# Patient Record
Sex: Female | Born: 1985 | Race: White | Hispanic: No | Marital: Single | Smoking: Current every day smoker
Health system: Southern US, Community
[De-identification: ages and names within clinical notes are randomized; demographics above are authoritative.]

## PROBLEM LIST (undated history)

## (undated) DIAGNOSIS — M1990 Unspecified osteoarthritis, unspecified site: Secondary | ICD-10-CM

## (undated) HISTORY — PX: WISDOM TOOTH EXTRACTION: SHX21

---
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
## 2005-04-14 ENCOUNTER — Ambulatory Visit: Payer: Self-pay | Admitting: Family Medicine

## 2007-09-30 ENCOUNTER — Emergency Department (HOSPITAL_COMMUNITY): Admission: EM | Admit: 2007-09-30 | Discharge: 2007-10-01 | Payer: Self-pay | Admitting: Emergency Medicine

## 2007-10-01 ENCOUNTER — Ambulatory Visit: Payer: Self-pay | Admitting: Psychiatry

## 2007-10-01 ENCOUNTER — Inpatient Hospital Stay (HOSPITAL_COMMUNITY): Admission: RE | Admit: 2007-10-01 | Discharge: 2007-10-04 | Payer: Self-pay | Admitting: Psychiatry

## 2007-10-14 ENCOUNTER — Ambulatory Visit (HOSPITAL_COMMUNITY): Payer: Self-pay | Admitting: Psychiatry

## 2007-10-20 ENCOUNTER — Ambulatory Visit (HOSPITAL_COMMUNITY): Payer: Self-pay | Admitting: Psychiatry

## 2007-10-27 ENCOUNTER — Ambulatory Visit (HOSPITAL_COMMUNITY): Payer: Self-pay | Admitting: Psychiatry

## 2008-01-11 ENCOUNTER — Ambulatory Visit (HOSPITAL_COMMUNITY): Payer: Self-pay | Admitting: Psychiatry

## 2008-09-14 ENCOUNTER — Ambulatory Visit (HOSPITAL_COMMUNITY): Payer: Self-pay | Admitting: Psychiatry

## 2008-10-31 ENCOUNTER — Ambulatory Visit (HOSPITAL_COMMUNITY): Payer: Self-pay | Admitting: Psychiatry

## 2011-01-21 NOTE — Discharge Summary (Signed)
NAME:  Angel Zimmerman, Angel Zimmerman             ACCOUNT NO.:  [id number]   MEDICAL RECORD NO.:  [id number]          PATIENT TYPE:  IPS   LOCATION:  0501                          FACILITY:  [facility]   PHYSICIAN:  Geoffery Lyons, M.D.      DATE OF BIRTH:  1985-12-08   DATE OF ADMISSION:  10/01/2007  DATE OF DISCHARGE:  10/04/2007                               DISCHARGE SUMMARY   CHIEF COMPLAINT AND HISTORY OF PRESENT ILLNESS:  This was the first  admission to [facility] for this 25 year old female  voluntarily admitted.  Endorsed that she needed help for her emotions.  She has been feeling depressed for years, getting to the point where she  was going to hurt herself.  She tried to hang herself, but she says  someone knocked at the door. She thought it was her mother and stopped.  The patient stated that she took herself to the emergency room, having  difficulty going to sleep.  Denies any hallucinations.  Stressors are  having trouble with her finances.  She knows that her father would help  her, but she feels guilty getting money from her father. Also endorsed  problem with the relationship with her ex-girlfriend.   PAST PSYCHIATRIC HISTORY:  First time at [facility].  No other  inpatient admissions or outpatient treatment because of mental health 2  years prior to this admission. Never on medications.   DRUG AND ALCOHOL HISTORY:  Denies active use of any substances.   MEDICAL HISTORY:  Asthma.   MEDICATIONS:  None prescribed.   Physical exam performed, failed to show any acute findings   LABORATORY WORKUP:  White blood cells 13.2.  Urine drug screen positive  for marijuana.   MENTAL STATUS EXAM:  Reveals alert, cooperative female with good eye  contact.  Speech is normal in rate, tempo and production.  Mood is  depressed. Affect depressed.  Thought processes are logical, coherent  and relevant.  No active delusions.  No active suicidal ideas, no  hallucinations.   Cognition well preserved.   ADMISSION DIAGNOSES:  AXIS I:  Major depressive disorder, rule out  marijuana abuse.  AXIS II:  No diagnosis.  AXIS III:  Asthma.  AXIS IV:  Moderate.  AXIS V:  Upon admission 35, high GAF in the last year 70.   COURSE IN THE HOSPITAL:  She was admitted.  She was started in  individual and group psychotherapy.  She was initially prescribed Zoloft  and Topamax. As already stated, endorsed that there was a lot of stuff  going on. She had to stay out of school this semester, goes to [employer] for  elementary vocation.  Patient works at [employer].  Is trying to work more  hours to get enough money to go to school next semester.  Had a  girlfriend on and off last several years. Feels that she was using her.  She is gone now weeks ago. Endorsed fluctuation in sleep, constantly  hungry, crying spells. Tried to hang herself with an electrical cord 6  feet up. Ex-girlfriend came for the mother.  She was  taken to the ED.  Has had suicidal ideas before but has never tried.  Has episodes of  increased energy, decreased sleep, irritability, anger.  Feels like two  different people. When angry, does not feel like herself. Endorsed  occasional alcohol use, has used marijuana, denies any other substances.  On January 24, there was a family session with the mother, her father,  and siblings.  Apparently the have always had problem with her  girlfriend mainly because she was hurtful to [place] and also tried to  make [place] his choose between her and her family. Otherwise they feel  [place] does well. She has a plan to go back to school to become an  EMS. The family lives close to each other.  An older brother has been  protective of her. She came out of the family 1-1/2 years ago.  There  was upset but still love and accept her even if they do not agree with  her choice.  They are concerned that [place] might go back with this  woman, and they would hope for her to have the  strength not to go back.  [place] agreed to contact the family if she had any thoughts of hurting  herself again.   She continued to improve, and on January 26 endorsed she was feeling  better.  She felt she was ready to go home.  Her mood was improved. Her  affect was brighter.  She was not going back with the girlfriend.  Relationship was over and endorsed that just by making that decision,  she felt so much better. Upon discharge, in full contact with reality.  No suicidal or homicidal ideations. Much improved from admission.   DISCHARGE DIAGNOSES:  AXIS I:  Mood disorder not otherwise specified.  AXIS II:  No diagnosis.  AXIS III:  Asthma.  AXIS IV:  Moderate.  AXIS V:  Upon discharge 55-60.   Discharged on:  1. Zoloft 50 mg per day,  2. Topamax 25 mg at night.  3. Trazodone 50 at bedtime as needed for sleep.   Follow up with Dr. Lolly Mustache at the [facility].      Geoffery Lyons, M.D.  Electronically Signed     IL/MEDQ  D:  10/18/2007  T:  10/19/2007  Job:  [id number]

## 2011-01-21 NOTE — H&P (Signed)
NAME:  Angel Zimmerman, Angel Zimmerman             ACCOUNT NO.:  [id number]   MEDICAL RECORD NO.:  [id number]          PATIENT TYPE:  IPS   LOCATION:  0501                          FACILITY:  [facility]   PHYSICIAN:  Geoffery Lyons, M.D.      DATE OF BIRTH:  November 18, 1985   DATE OF ADMISSION:  10/01/2007  DATE OF DISCHARGE:                       PSYCHIATRIC ADMISSION ASSESSMENT   25 year old female who was voluntarily admitted on September 30, 2007.   HISTORY OF PRESENT ILLNESS:  The patient is here.  She states she needed  help for her emotions.  She has been feeling depressed for years.  She  was getting to the point where she was going to hurt herself.  She  states she tried to hang herself, but she states someone knocked at the  door.  She though it was her mother and stopped.  The patient states  that she took herself to the emergency room.  She is having difficulty  going to sleep.  She denies any hallucinations.  The patient's stressors  are she is having trouble with her finances.  She states her father  helps her out but she feels guilty getting money from her father.  She  states also problems with her relationship with her ex-girlfriend.   PAST PSYCHIATRIC HISTORY:  First admission to [facility].  No other inpatient admissions.  No current outpatient therapy.  Did go  to mental health 2 years ago.  States was never on any medication.   SOCIAL HISTORY:  She is a single female with no children.  She lives  alone.  She works at [employer].  She has completed 1 year of college.  Denies any legal problems.   FAMILY HISTORY:  Believes her mother has problems with depression.  Mother is not on any medications at this time.  The patient smokes.  Smokes marijuana but her last use she states was a few months ago.   PRIMARY CARE PHYSICIAN:  [facility].   PAST MEDICAL HISTORY:  Asthma.   MEDICATIONS:  None prior to arrival.   DRUG ALLERGIES:  NO KNOWN DRUG ALLERGIES.   The patient was  fully assessed at [facility] emergency department.  Temperature 99, heart rate 98, respirations 18, blood pressure is  117/78.  She is 221 pounds, 5 feet 1 inches tall.   Her WBC count was 13.2.  Alcohol level less than 5.  Urinalysis shows  RBCs too numerous to count.  The patient is on her menses.  Urine drug  screen is positive for THC.  Urine pregnancy test is negative.   MENTAL STATUS EXAM:  She is in the bed, cooperative, good eye contact.  Speech is clear, normal pace and tone.  The patient's mood is neutral.  The patient's affect, she appears sad and flat.  Thought processes are  coherent.  No evidence of any thought disorder.  Cognitive function  intact.  Her memory is good.  Judgment and insight is fair.  She appears  sincere.   AXIS I:  1. Depressive disorder, not otherwise specified.  2. Cannabis abuse.   AXIS II:  Deferred.  AXIS III:  Asthma.   AXIS IV:  Problems with economic issues, psychosocial problems.   AXIS V:  Current is 35-40.   PLAN:  Contract for safety.  Stabilize mood and thinking.  We will  initiate Zoloft and Topamax.  Will monitor her closely for any side  effects.  The patient may need some individual therapy.  We will also  consider family session with her support group which may be her father.  Will also address her substance use.  Length of stay is 3-5 days.      Landry Corporal, N.P.      Geoffery Lyons, M.D.  Electronically Signed    JO/MEDQ  D:  10/01/2007  T:  10/01/2007  Job:  [id number]

## 2011-05-29 LAB — URINALYSIS, ROUTINE W REFLEX MICROSCOPIC
Glucose, UA: NEGATIVE
Specific Gravity, Urine: 1.022
Urobilinogen, UA: 0.2
pH: 6.5

## 2011-05-29 LAB — I-STAT 8, (EC8 V) (CONVERTED LAB)
Acid-base deficit: 2
Bicarbonate: 23.1
Operator id: 285841
TCO2: 24
pCO2, Ven: 41.8 — ABNORMAL LOW
pH, Ven: 7.351 — ABNORMAL HIGH

## 2011-05-29 LAB — CBC
HCT: 41
Hemoglobin: 14.1
MCHC: 34.4
MCV: 85.6
Platelets: 342
RBC: 4.79
RDW: 13.7
WBC: 13.2 — ABNORMAL HIGH

## 2011-05-29 LAB — DIFFERENTIAL
Basophils Absolute: 0.1
Basophils Relative: 0
Eosinophils Absolute: 0.1
Eosinophils Relative: 1
Lymphocytes Relative: 20
Lymphs Abs: 2.6
Monocytes Absolute: 0.8
Monocytes Relative: 6
Neutro Abs: 9.6 — ABNORMAL HIGH
Neutrophils Relative %: 73

## 2011-05-29 LAB — URINE MICROSCOPIC-ADD ON

## 2011-05-29 LAB — POCT PREGNANCY, URINE
Operator id: 285841
Preg Test, Ur: NEGATIVE

## 2011-05-29 LAB — ETHANOL: Alcohol, Ethyl (B): <5

## 2011-05-29 LAB — RAPID URINE DRUG SCREEN, HOSP PERFORMED
Amphetamines: NOT DETECTED
Barbiturates: NOT DETECTED

## 2012-02-19 ENCOUNTER — Emergency Department (HOSPITAL_COMMUNITY)
Admission: EM | Admit: 2012-02-19 | Discharge: 2012-02-19 | Disposition: A | Discharge status: Home / Self Care | Payer: Self-pay | Attending: Emergency Medicine | Admitting: Emergency Medicine

## 2012-02-19 ENCOUNTER — Encounter (HOSPITAL_COMMUNITY): Payer: Self-pay

## 2012-02-19 DIAGNOSIS — M129 Arthropathy, unspecified: Secondary | ICD-10-CM | POA: Insufficient documentation

## 2012-02-19 DIAGNOSIS — F17200 Nicotine dependence, unspecified, uncomplicated: Secondary | ICD-10-CM | POA: Insufficient documentation

## 2012-02-19 DIAGNOSIS — M549 Dorsalgia, unspecified: Secondary | ICD-10-CM | POA: Insufficient documentation

## 2012-02-19 HISTORY — DX: Unspecified osteoarthritis, unspecified site: M19.90

## 2012-02-19 LAB — URINALYSIS, ROUTINE W REFLEX MICROSCOPIC
Bilirubin Urine: NEGATIVE
Glucose, UA: NEGATIVE mg/dL
Hgb urine dipstick: NEGATIVE
Ketones, ur: NEGATIVE mg/dL
Leukocytes, UA: NEGATIVE
Nitrite: NEGATIVE
Protein, ur: NEGATIVE mg/dL
Specific Gravity, Urine: 1.033 — ABNORMAL HIGH (ref 1.005–1.030)
Urobilinogen, UA: 0.2 mg/dL (ref 0.0–1.0)
pH: 5.5 (ref 5.0–8.0)

## 2012-02-19 LAB — POCT PREGNANCY, URINE: Preg Test, Ur: NEGATIVE

## 2012-02-19 MED ORDER — HYDROCODONE-ACETAMINOPHEN 5-325 MG PO TABS: 2.0000 | ORAL_TABLET | ORAL | Status: AC | PRN

## 2012-02-19 MED ORDER — IBUPROFEN 600 MG PO TABS: 600.0000 mg | ORAL_TABLET | Freq: Four times a day (QID) | ORAL | Status: AC | PRN

## 2012-02-19 MED ORDER — DIAZEPAM 5 MG PO TABS: 5.0000 mg | ORAL_TABLET | Freq: Two times a day (BID) | ORAL | Status: AC

## 2012-02-19 NOTE — ED Provider Notes (Signed)
History     CSN: [id number]  Arrival date & time 02/19/12  1844   First MD Initiated Contact with Patient 02/19/12 2109      Chief Complaint  Patient presents with  . Back Pain    (Consider location/radiation/quality/duration/timing/severity/associated sxs/prior treatment) HPI Comments: Patient reports that she began having pain across her lower back earlier today after mowing her lawn. Pain radiates to her right hip.  No acute injury or trauma.  She has not taken anything for the pain.  Pain worse with extension of lower back.    Patient is a 26 y.o. female presenting with back pain. The history is provided by the patient.  Back Pain  This is a new problem. The current episode started 6 to 12 hours ago. The problem occurs constantly. The problem has not changed since onset.The pain is associated with no known injury. The pain is present in the lumbar spine. The pain is moderate. The symptoms are aggravated by twisting and bending. Pertinent negatives include no fever, no numbness, no abdominal pain, no bowel incontinence, no perianal numbness, no bladder incontinence, no dysuria, no paresthesias, no paresis, no tingling and no weakness.    Past Medical History  Diagnosis Date  . Arthritis     No past surgical history on file.  No family history on file.  History  Substance Use Topics  . Smoking status: Current Everyday Smoker -- 0.5 packs/day  . Smokeless tobacco: Not on file  . Alcohol Use: Yes     social    OB History    Grav Para Term Preterm Abortions TAB SAB Ect Mult Living                  Review of Systems  Constitutional: Negative for fever and chills.  HENT: Negative for neck pain and neck stiffness.   Gastrointestinal: Negative for nausea, vomiting, abdominal pain and bowel incontinence.  Genitourinary: Negative for bladder incontinence, dysuria, hematuria and decreased urine volume.       No bowel or bladder incontinence  Musculoskeletal: Positive for  back pain. Negative for joint swelling and gait problem.  Skin: Negative for color change and wound.  Neurological: Negative for tingling, weakness, numbness and paresthesias.  Psychiatric/Behavioral: Negative for confusion.    Allergies  Review of patient's allergies indicates no known allergies.  Home Medications  No current outpatient prescriptions on file.  BP 111/56  Pulse 55  Temp 97.9 F (36.6 C) (Oral)  Resp 20  Ht 5' (1.524 m)  Wt 215 lb (97.523 kg)  BMI 41.99 kg/m2  SpO2 100%  LMP 01/26/2012  Physical Exam  Nursing note and vitals reviewed. Constitutional: She is oriented to person, place, and time. She appears well-developed and well-nourished. No distress.  HENT:  Head: Normocephalic and atraumatic.  Eyes: Conjunctivae and EOM are normal. Pupils are equal, round, and reactive to light. No scleral icterus.  Neck: Normal range of motion and full passive range of motion without pain. Neck supple. No spinous process tenderness and no muscular tenderness present. No rigidity. Normal range of motion present. No Brudzinski's sign noted.  Cardiovascular: Normal rate, regular rhythm and intact distal pulses.  Exam reveals no gallop and no friction rub.   No murmur heard. Pulmonary/Chest: Effort normal and breath sounds normal. No respiratory distress. She has no wheezes. She has no rales. She exhibits no tenderness.  Musculoskeletal:       Cervical back: She exhibits normal range of motion, no tenderness, no bony tenderness  and no pain.       Thoracic back: She exhibits no tenderness, no bony tenderness and no pain.       Lumbar back: She exhibits no tenderness, no bony tenderness, no deformity, no spasm and normal pulse.       Bilateral lower extremities nontender without color change, baseline range of motion of extremities with intact distal pulses, capillary refill less than 2 seconds bilaterally.  Pt has increased pain w ROM of lumbar spine. Pain w ambulation, no sign of  ataxia.  Neurological: She is alert and oriented to person, place, and time. She has normal strength and normal reflexes. No sensory deficit. Gait (no ataxia, slowed and hunched d/t pain ) abnormal.       Sensation at baseline for light touch in all 4 distal extremities, motor symmetric & bilateral 5/5 (hips: abduction, adduction, flexion; knee: flexion & extension; foot: dorsiflexion, plantar flexion, toes: dorsi flexion) Patellar & ankle reflexes intact.   Skin: Skin is warm and dry. No rash noted. She is not diaphoretic. No erythema. No pallor.  Psychiatric: She has a normal mood and affect.    ED Course  Procedures (including critical care time)  Labs Reviewed  URINALYSIS, ROUTINE W REFLEX MICROSCOPIC - Abnormal; Notable for the following:    APPearance CLOUDY (*)     Specific Gravity, Urine 1.033 (*)     All other components within normal limits  POCT PREGNANCY, URINE   No results found.   No diagnosis found.    MDM  Patient with back pain.  No neurological deficits and normal neuro exam.  Patient can walk but states is painful.  No loss of bowel or bladder control.  No concern for cauda equina.  No fever, night sweats, weight loss, h/o cancer, IVDU.  Pain most likely muscular.  RICE protocol and pain medicine indicated and discussed with patient.  Patient also given prescription for muscle relaxer.        Pascal Lux Cache, PA-C 02/20/12 0225

## 2012-02-19 NOTE — ED Notes (Signed)
Pt c/o low back pain radiating into right hip. Denies trauma, states mow the yard today. Pt moving slowly guarding.

## 2012-02-19 NOTE — Discharge Instructions (Signed)
Followup with orthopedics if symptoms continue. Use conservative methods at home including heat therapy and cold therapy as we discussed. More information on cold therapy is listed below.  It is not recommended to use heat treatment directly after an acute injury.  SEEK IMMEDIATE MEDICAL ATTENTION IF: New numbness, tingling, weakness, or problem with the use of your arms or legs.  Severe back pain not relieved with medications.  Change in bowel or bladder control.  Increasing pain in any areas of the body (such as chest or abdominal pain).  Shortness of breath, dizziness or fainting.  Nausea (feeling sick to your stomach), vomiting, fever, or sweats.  COLD THERAPY DIRECTIONS:  Ice or gel packs can be used to reduce both pain and swelling. Ice is the most helpful within the first 24 to 48 hours after an injury or flareup from overusing a muscle or joint.  Ice is effective, has very few side effects, and is safe for most people to use.   If you expose your skin to cold temperatures for too long or without the proper protection, you can damage your skin or nerves. Watch for signs of skin damage due to cold.   HOME CARE INSTRUCTIONS  Follow these tips to use ice and cold packs safely.  Place a dry or damp towel between the ice and skin. A damp towel will cool the skin more quickly, so you may need to shorten the time that the ice is used.  For a more rapid response, add gentle compression to the ice.  Ice for no more than 10 to 20 minutes at a time. The bonier the area you are icing, the less time it will take to get the benefits of ice.  Check your skin after 5 minutes to make sure there are no signs of a poor response to cold or skin damage.  Rest 20 minutes or more in between uses.  Once your skin is numb, you can end your treatment. You can test numbness by very lightly touching your skin. The touch should be so light that you do not see the skin dimple from the pressure of your fingertip. When  using ice, most people will feel these normal sensations in this order: cold, burning, aching, and numbness.  Do not use ice on someone who cannot communicate their responses to pain, such as small children or people with dementia.   HOW TO MAKE AN ICE PACK  To make an ice pack, do one of the following:  Place crushed ice or a bag of frozen vegetables in a sealable plastic bag. Squeeze out the excess air. Place this bag inside another plastic bag. Slide the bag into a pillowcase or place a damp towel between your skin and the bag.  Mix 3 parts water with 1 part rubbing alcohol. Freeze the mixture in a sealable plastic bag. When you remove the mixture from the freezer, it will be slushy. Squeeze out the excess air. Place this bag inside another plastic bag. Slide the bag into a pillowcase or place a damp towel between your skin and the bag.   SEEK MEDICAL CARE IF:  You develop white spots on your skin. This may give the skin a blotchy (mottled) appearance.  Your skin turns blue or pale.  Your skin becomes waxy or hard.  Your swelling gets worse.  MAKE SURE YOU:  Understand these instructions.  Will watch your condition.  Will get help right away if you are not doing well or  get worse.    RESOURCE GUIDE  Chronic Pain Problems: Contact [facility]  [phone] Patients need to be referred by their primary care doctor.  Insufficient Money for Medicine: Contact United Way:  call "211" or [facility] 402-541-0603.  No Primary Care Doctor: - Call [facility]  [phone] - can help you locate a primary care doctor that  accepts your insurance, provides certain services, etc. - Physician Referral Service- [phone]  Agencies that provide inexpensive medical care: - [facility]  [phone] - [facility]  [phone] - [facility]  [phone] - Women's Clinic  [phone] - Planned Parenthood  [phone] [facility]  [phone]  Medicaid-accepting [facility] Providers: - [facility]- [address]  [phone], Mon-Fri 9am-7pm, Sat 9am-1pm - [facility]- [address] [place], [address] [place]  [phone] - [facility]- [address] [place]  [phone] [facility]- [address]  [phone] - Renaye Rakers- [address] [place], [address], [phone]  Only accepts [place] patients after they have their name  applied to their card  Self Pay (no insurance) in [place]: - Sickle Cell Patients: Dr Willey Blade, [facility]  [address] [place], [phone] - [facility]- [address] [place]  [phone]       [facility]- [address]       -     [facility]- see information above (Speak to [employer] if you do not have insurance)       -  [facility]- [address] [place], [phone]       -  [facility]- [address],  [phone]       -  [facility]- [address] [phone]       -  Dr Julio Sicks-  [address], [place], [phone]       -  [facility]- [address], [phone]       -  [facility]- [address] [phone], also [address], [phone]       -    [facility]- [address] [place], [phone], 1st & 3rd Saturday   every month, 10am-1pm  1) Find a Doctor and Pay Out of Pocket Although you won't have to find out who is covered by your insurance plan, it is a good idea to ask around and get recommendations. You will then need to call the office and see if the doctor you have chosen will accept you as a new patient and what types of options they offer for patients who are self-pay. Some doctors offer discounts or will set up payment plans for their patients who do not have insurance, but you will need to ask so you  aren't surprised when you get to your appointment.  2) Contact Your Local Health Department Not all health departments have doctors that can see patients for sick visits, but many do, so it is worth a call to see if yours does. If you don't know where your local health department is, you can check in your phone book. The CDC also has a tool to help you locate your state's health department,  and many state websites also have listings of all of their local health departments.  3) Find a Walk-in Clinic If your illness is not likely to be very severe or complicated, you may want to try a walk in clinic. These are popping up all over the country in pharmacies, drugstores, and shopping centers. They're usually staffed by nurse practitioners or physician assistants that have been trained to treat common illnesses and complaints. They're usually fairly quick and inexpensive. However, if you have serious medical issues or chronic medical problems, these are probably not your best option  STD Testing - [facility] of [facility], STD Clinic, [address], [place], phone [phone] or [phone].  Monday - Friday, call for an appointment. [facility] Department of [employer], STD Clinic, [address], [place], phone [phone] or [phone].  Monday - Friday, call for an appointment.  Abuse/Neglect: [facility] Child Abuse Hotline 980-846-3587 [facility] Child Abuse Hotline (267) 173-9881 (After Hours)  Emergency Shelter:  [facility] 984-446-5740  Maternity Homes: - [facility] [phone] - [facility] [phone]  MRSA Hotline #:   401-336-7088  [facility]  [facility] [place]  United Way [facility] Dept. [address]                 [address]         [address] [place] [address]  Blondell Reveal Phone:  [phone]                                  Phone:  [phone]                   Phone:  [phone]  [facility], [phone] - [phone]       -     [facility] in [place], [address],                                  [phone], [facility] Child Abuse Hotline 313-101-1299 or 218-467-2372 (After Hours)   Behavioral Health Services  Substance Abuse Resources: - [facility] Drug [facility]  [phone] - [facility] [phone] - [facility] [phone] Floydene Flock [phone] - Residential & Outpatient Substance Abuse Program  [phone]  Psychological Services: [facility]  ([phone] Services  [phone] - [facility], [phone] [place]. [address] [place], ACCESS LINE: [phone] or 3375202751, [employer]  Dental Assistance  If unable to pay or uninsured, contact:  [facility] or [facility]. to become qualified for the adult dental clinic.  Patients with Medicaid: [facility] [phone] [address], [phone] Shonna Chock  [address], [phone]  If unable to pay, or uninsured, contact [facility] [phone]) or [facility] Department [phone] in [place], [phone] in [facility]) to become qualified for the adult dental clinic  Other Low-Cost Community Dental Services: - [facility]- [address] [place], [place], [place], [address], [phone], [address]. [address], 2nd and 4th Thursday of the month at 6:30am.  10 clients each day by appointment, can sometimes see walk-in patients if someone does not show for an appointment. [facility]- [address] [place], [place], [address], [phone] - [facility]- [address], [place], [place], [address], [phone] - [facility]- [phone] [facility] Department- [phone] [facility] Department- [phone]

## 2012-02-20 NOTE — ED Provider Notes (Signed)
Medical screening examination/treatment/procedure(s) were performed by non-physician practitioner and as supervising physician I was immediately available for consultation/collaboration.  Raeford Razor, MD 02/20/12 1319

## 2013-05-19 ENCOUNTER — Encounter: Payer: Self-pay | Admitting: Nurse Practitioner

## 2013-05-19 ENCOUNTER — Ambulatory Visit (INDEPENDENT_AMBULATORY_CARE_PROVIDER_SITE_OTHER): Payer: BC Managed Care – PPO | Admitting: Nurse Practitioner

## 2013-05-19 ENCOUNTER — Ambulatory Visit (INDEPENDENT_AMBULATORY_CARE_PROVIDER_SITE_OTHER): Payer: BC Managed Care – PPO

## 2013-05-19 ENCOUNTER — Telehealth: Payer: Self-pay | Admitting: Nurse Practitioner

## 2013-05-19 VITALS — BP 117/75 | HR 51 | Temp 97.9°F | Ht 61.0 in | Wt 226.0 lb

## 2013-05-19 DIAGNOSIS — R0781 Pleurodynia: Secondary | ICD-10-CM

## 2013-05-19 DIAGNOSIS — R079 Chest pain, unspecified: Secondary | ICD-10-CM

## 2013-05-19 MED ORDER — IBUPROFEN 800 MG PO TABS: 800.0000 mg | ORAL_TABLET | Freq: Three times a day (TID) | ORAL | Status: DC | PRN

## 2013-05-19 MED ORDER — CYCLOBENZAPRINE HCL 5 MG PO TABS: 5.0000 mg | ORAL_TABLET | Freq: Three times a day (TID) | ORAL | Status: DC | PRN

## 2013-05-19 NOTE — Telephone Encounter (Signed)
Patient complains of back/rib pain. No known injury and no cough. Appt scheduled.  Patient aware.

## 2013-05-19 NOTE — Patient Instructions (Signed)
Muscle Strain  Muscle strain occurs when a muscle is stretched beyond its normal length. A small number of muscle fibers generally are torn. This is especially common in athletes. This happens when a sudden, violent force placed on a muscle stretches it too far. Usually, recovery from muscle strain takes 1 to 2 weeks. Complete healing will take 5 to 6 weeks.   HOME CARE INSTRUCTIONS    While awake, apply ice to the sore muscle for the first 2 days after the injury.   Put ice in a plastic bag.   Place a towel between your skin and the bag.   Leave the ice on for 15-20 minutes each hour.   Do not use the strained muscle for several days, until you no longer have pain.   You may wrap the injured area with an elastic bandage for comfort. Be careful not to wrap it too tightly. This may interfere with blood circulation or increase swelling.   Only take over-the-counter or prescription medicines for pain, discomfort, or fever as directed by your caregiver.  SEEK MEDICAL CARE IF:   You have increasing pain or swelling in the injured area.  MAKE SURE YOU:    Understand these instructions.   Will watch your condition.   Will get help right away if you are not doing well or get worse.  Document Released: 08/25/2005 Document Revised: 11/17/2011 Document Reviewed: 09/06/2011  ExitCare Patient Information 2014 [employer].

## 2013-05-19 NOTE — Progress Notes (Signed)
  Subjective:    Patient ID: Angel Zimmerman, female    DOB: 1985-10-09, 27 y.o.   MRN: [id number]  Flank Pain This is a new problem. The current episode started in the past 7 days (denies injury). The problem occurs constantly. The problem has been waxing and waning since onset. The quality of the pain is described as stabbing. The pain does not radiate. The pain is at a severity of 6/10. The pain is moderate. The pain is worse during the day. The symptoms are aggravated by lying down and coughing. Stiffness is present in the morning. Pertinent negatives include no chest pain or fever. Risk factors include obesity. Treatments tried: tried wrapping with tight band  The treatment provided no relief.      Review of Systems  Constitutional: Negative for fever, chills, activity change, appetite change and fatigue.  Respiratory: Negative for cough, chest tightness and shortness of breath.   Cardiovascular: Negative for chest pain, palpitations and leg swelling.  Genitourinary: Positive for flank pain.  Musculoskeletal: Positive for back pain (mild but normal for her).       Objective:   Physical Exam  Constitutional: She appears well-developed and well-nourished.  Cardiovascular: Normal rate, regular rhythm and normal heart sounds.   Pulmonary/Chest: Effort normal and breath sounds normal.  Point tenderness on palpation along 10th rib  Skin: Skin is warm. No rash noted. No erythema.   BP 117/75  Pulse 51  Temp(Src) 97.9 F (36.6 C) (Oral)  Ht 5\' 1"  (1.549 m)  Wt 226 lb (102.513 kg)  BMI 42.72 kg/m2 Chest x ray- no ab=cute findings-Preliminary reading by Paulene Floor, FNP  [facility]        Assessment & Plan:  1. Rib pain on left side *moist heat No heavy lifting - DG Chest 2 View; Future - cyclobenzaprine (FLEXERIL) 5 MG tablet; Take 1 tablet (5 mg total) by mouth 3 (three) times daily as needed for muscle spasms.  Dispense: 30 tablet; Refill: 1 - ibuprofen (ADVIL,MOTRIN) 800 MG  tablet; Take 1 tablet (800 mg total) by mouth every 8 (eight) hours as needed for pain.  Dispense: 30 tablet; Refill: 0 Out of work till Monday  Bennie Pierini, FNP

## 2013-07-05 ENCOUNTER — Other Ambulatory Visit (HOSPITAL_COMMUNITY)
Admission: RE | Admit: 2013-07-05 | Discharge: 2013-07-05 | Disposition: A | Discharge status: Home / Self Care | Payer: Self-pay | Source: Ambulatory Visit | Attending: Unknown Physician Specialty | Admitting: Unknown Physician Specialty

## 2013-07-05 DIAGNOSIS — N870 Mild cervical dysplasia: Secondary | ICD-10-CM | POA: Insufficient documentation

## 2013-07-05 DIAGNOSIS — R87810 Cervical high risk human papillomavirus (HPV) DNA test positive: Secondary | ICD-10-CM | POA: Insufficient documentation

## 2013-07-05 DIAGNOSIS — Z124 Encounter for screening for malignant neoplasm of cervix: Secondary | ICD-10-CM | POA: Insufficient documentation

## 2013-07-05 DIAGNOSIS — R87612 Low grade squamous intraepithelial lesion on cytologic smear of cervix (LGSIL): Secondary | ICD-10-CM | POA: Insufficient documentation

## 2016-12-25 ENCOUNTER — Encounter: Payer: Self-pay | Admitting: Family Medicine

## 2016-12-25 ENCOUNTER — Ambulatory Visit (INDEPENDENT_AMBULATORY_CARE_PROVIDER_SITE_OTHER): Payer: Commercial Managed Care - HMO | Admitting: Family Medicine

## 2016-12-25 VITALS — BP 120/78 | HR 67 | Temp 98.2°F | Ht 61.0 in | Wt 229.0 lb

## 2016-12-25 DIAGNOSIS — F17200 Nicotine dependence, unspecified, uncomplicated: Secondary | ICD-10-CM | POA: Diagnosis not present

## 2016-12-25 DIAGNOSIS — J40 Bronchitis, not specified as acute or chronic: Secondary | ICD-10-CM | POA: Diagnosis not present

## 2016-12-25 MED ORDER — AZITHROMYCIN 250 MG PO TABS: ORAL_TABLET | ORAL | 0 refills | Status: DC

## 2016-12-25 MED ORDER — BUPROPION HCL ER (XL) 150 MG PO TB24: 150.0000 mg | ORAL_TABLET | Freq: Every day | ORAL | 1 refills | Status: DC

## 2016-12-25 MED ORDER — ALBUTEROL SULFATE HFA 108 (90 BASE) MCG/ACT IN AERS: 2.0000 | INHALATION_SPRAY | Freq: Four times a day (QID) | RESPIRATORY_TRACT | 0 refills | Status: DC | PRN

## 2016-12-25 NOTE — Progress Notes (Signed)
BP 120/78   Pulse 67   Temp 98.2 F (36.8 C) (Oral)   Ht  (1.549 m)   Wt 229 lb (103.9 kg)   LMP 12/24/2016 (Exact Date)   SpO2 98%   BMI 43.27 kg/m    Subjective:    Patient ID: Angel Zimmerman, female    DOB: [birth year], 31 y.o.   MRN: [id number]  HPI: Angel Zimmerman is a 31 y.o. female presenting on 12/25/2016 for Cough (x 7-10 days; worse at night; taking OTC Mucinex DM and cold syrup); Chest congestion; and Wheezing   HPI Cough and congestion and wheezing Patient has been having cough and congestion and wheezing this been going on for the past 7 or 10 days. She has been having increased wheezing and coughing spells at night especially. They've been waking her up at night and keeping her up. She denies any fevers or chills or shortness of breath. She has been using over-the-counter Mucinex and it does not seem to be helping as much. She does say she has a history of asthma as a child and feels like she is having that again right now. He has been using some cough syrup as well which helps some.  Relevant past medical, surgical, family and social history reviewed and updated as indicated. Interim medical history since our last visit reviewed. Allergies and medications reviewed and updated.  Review of Systems  Constitutional: Negative for chills and fever.  HENT: Positive for congestion, postnasal drip, rhinorrhea, sinus pressure, sneezing and sore throat. Negative for ear discharge and ear pain.   Eyes: Negative for pain, redness and visual disturbance.  Respiratory: Positive for wheezing. Negative for chest tightness and shortness of breath.   Cardiovascular: Negative for chest pain and leg swelling.  Genitourinary: Negative for difficulty urinating and dysuria.  Musculoskeletal: Negative for back pain and gait problem.  Skin: Negative for rash.  Neurological: Negative for light-headedness and headaches.  Psychiatric/Behavioral: Negative for agitation and behavioral  problems.  All other systems reviewed and are negative.   Per HPI unless specifically indicated above  Social History   Social History  . Marital status: Single    Spouse name: N/A  . Number of children: N/A  . Years of education: N/A   Occupational History  . Not on file.   Social History Main Topics  . Smoking status: Current Every Day Smoker    Packs/day: 1.00    Years: 10.00  . Smokeless tobacco: Never Used  . Alcohol use Yes     Comment: social  . Drug use: No  . Sexual activity: Yes    Birth control/ protection: None   Other Topics Concern  . Not on file   Social History Narrative  . No narrative on file    Past Surgical History:  Procedure Laterality Date  . WISDOM TOOTH EXTRACTION      Family History  Problem Relation Age of Onset  . COPD Mother   . Cancer Mother     esophogea  . Stroke Father   . COPD Maternal Grandmother   . Heart disease Maternal Grandmother   . Diabetes Maternal Grandfather   . Heart disease Maternal Grandfather   . Heart disease Paternal Grandmother     Allergies as of 12/25/2016   No Known Allergies     Medication List       Accurate as of 12/25/16  8:48 AM. Always use your most recent med list.  albuterol 108 (90 Base) MCG/ACT inhaler Commonly known as:  PROVENTIL HFA;VENTOLIN HFA Inhale 2 puffs into the lungs every 6 (six) hours as needed for wheezing or shortness of breath.   azithromycin 250 MG tablet Commonly known as:  ZITHROMAX Take 2 the first day and then one each day after.   buPROPion 150 MG 24 hr tablet Commonly known as:  WELLBUTRIN XL Take 1 tablet (150 mg total) by mouth daily.          Objective:    BP 120/78   Pulse 67   Temp 98.2 F (36.8 C) (Oral)   Ht  (1.549 m)   Wt 229 lb (103.9 kg)   LMP 12/24/2016 (Exact Date)   SpO2 98%   BMI 43.27 kg/m   Wt Readings from Last 3 Encounters:  12/25/16 229 lb (103.9 kg)  05/19/13 226 lb (102.5 kg)  02/19/12 215 lb (97.5 kg)      Physical Exam  Constitutional: She is oriented to person, place, and time. She appears well-developed and well-nourished. No distress.  HENT:  Right Ear: Tympanic membrane, external ear and ear canal normal.  Left Ear: Tympanic membrane, external ear and ear canal normal.  Nose: Mucosal edema and rhinorrhea present. No epistaxis. Right sinus exhibits no maxillary sinus tenderness and no frontal sinus tenderness. Left sinus exhibits no maxillary sinus tenderness and no frontal sinus tenderness.  Mouth/Throat: Uvula is midline and mucous membranes are normal. Posterior oropharyngeal edema and posterior oropharyngeal erythema present. No oropharyngeal exudate or tonsillar abscesses.  Eyes: Conjunctivae and EOM are normal.  Cardiovascular: Normal rate, regular rhythm, normal heart sounds and intact distal pulses.   No murmur heard. Pulmonary/Chest: Effort normal and breath sounds normal. No respiratory distress. She has no wheezes.  Musculoskeletal: Normal range of motion. She exhibits no edema or tenderness.  Neurological: She is alert and oriented to person, place, and time. Coordination normal.  Skin: Skin is warm and dry. No rash noted. She is not diaphoretic.  Psychiatric: She has a normal mood and affect. Her behavior is normal.  Vitals reviewed.   Results for orders placed or performed during the hospital encounter of 02/19/12  U/A (may I&O cath if menses)  Result Value Ref Range   Color, Urine YELLOW YELLOW   APPearance CLOUDY (A) CLEAR   Specific Gravity, Urine 1.033 (H) 1.005 - 1.030   pH 5.5 5.0 - 8.0   Glucose, UA NEGATIVE NEGATIVE mg/dL   Hgb urine dipstick NEGATIVE NEGATIVE   Bilirubin Urine NEGATIVE NEGATIVE   Ketones, ur NEGATIVE NEGATIVE mg/dL   Protein, ur NEGATIVE NEGATIVE mg/dL   Urobilinogen, UA 0.2 0.0 - 1.0 mg/dL   Nitrite NEGATIVE NEGATIVE   Leukocytes, UA NEGATIVE NEGATIVE  Pregnancy, urine POC  Result Value Ref Range   Preg Test, Ur NEGATIVE NEGATIVE       Assessment & Plan:   Problem List Items Addressed This Visit    None    Visit Diagnoses    Bronchitis    -  Primary   Also recommended for her to pick up an allergy pill, she does have a history of asthma so sent an albuterol inhaler with the azithromycin.   Relevant Medications   azithromycin (ZITHROMAX) 250 MG tablet   albuterol (PROVENTIL HFA;VENTOLIN HFA) 108 (90 Base) MCG/ACT inhaler   Needs smoking cessation education       Relevant Medications   buPROPion (WELLBUTRIN XL) 150 MG 24 hr tablet       Follow up  plan: Return in about 4 weeks (around 01/22/2017), or if symptoms worsen or fail to improve, for Pap smear and well exam and fasting labs, recheck smoking.  Arville Care, MD [facility] 12/25/2016, 8:48 AM

## 2017-01-28 ENCOUNTER — Encounter: Payer: Commercial Managed Care - HMO | Admitting: Family Medicine

## 2017-02-12 ENCOUNTER — Encounter: Payer: Commercial Managed Care - HMO | Admitting: Family Medicine

## 2017-02-13 ENCOUNTER — Encounter: Payer: Commercial Managed Care - HMO | Admitting: Family Medicine

## 2017-02-19 ENCOUNTER — Ambulatory Visit (INDEPENDENT_AMBULATORY_CARE_PROVIDER_SITE_OTHER): Payer: 59 | Admitting: Family Medicine

## 2017-02-19 ENCOUNTER — Encounter: Payer: Self-pay | Admitting: Family Medicine

## 2017-02-19 VITALS — BP 113/79 | HR 74 | Temp 97.6°F | Ht 61.0 in | Wt 221.0 lb

## 2017-02-19 DIAGNOSIS — M222X2 Patellofemoral disorders, left knee: Secondary | ICD-10-CM

## 2017-02-19 DIAGNOSIS — Z0000 Encounter for general adult medical examination without abnormal findings: Secondary | ICD-10-CM

## 2017-02-19 DIAGNOSIS — Z131 Encounter for screening for diabetes mellitus: Secondary | ICD-10-CM

## 2017-02-19 DIAGNOSIS — Z13220 Encounter for screening for lipoid disorders: Secondary | ICD-10-CM

## 2017-02-19 DIAGNOSIS — F17200 Nicotine dependence, unspecified, uncomplicated: Secondary | ICD-10-CM

## 2017-02-19 LAB — LIPID PANEL
CHOL/HDL RATIO: 2.9 ratio (ref 0.0–4.4)
CHOLESTEROL TOTAL: 140 mg/dL (ref 100–199)
HDL: 49 mg/dL (ref >39)
LDL CALC: 75 mg/dL (ref 0–99)
TRIGLYCERIDES: 78 mg/dL (ref 0–149)
VLDL CHOLESTEROL CAL: 16 mg/dL (ref 5–40)

## 2017-02-19 LAB — CMP14+EGFR
A/G RATIO: 1.5 (ref 1.2–2.2)
ALK PHOS: 48 IU/L (ref 39–117)
ALT: 12 IU/L (ref 0–32)
AST: 16 IU/L (ref 0–40)
Albumin: 3.8 g/dL (ref 3.5–5.5)
BILIRUBIN TOTAL: 0.3 mg/dL (ref 0.0–1.2)
BUN/Creatinine Ratio: 13 (ref 9–23)
BUN: 8 mg/dL (ref 6–20)
CALCIUM: 8.8 mg/dL (ref 8.7–10.2)
CHLORIDE: 104 mmol/L (ref 96–106)
CO2: 24 mmol/L (ref 20–29)
Creatinine, Ser: 0.61 mg/dL (ref 0.57–1.00)
GFR calc Af Amer: 140 mL/min/{1.73_m2} (ref >59)
GFR calc non Af Amer: 121 mL/min/{1.73_m2} (ref >59)
Globulin, Total: 2.5 g/dL (ref 1.5–4.5)
Glucose: 82 mg/dL (ref 65–99)
POTASSIUM: 4.6 mmol/L (ref 3.5–5.2)
Sodium: 140 mmol/L (ref 134–144)
Total Protein: 6.3 g/dL (ref 6.0–8.5)

## 2017-02-19 LAB — CBC WITH DIFFERENTIAL/PLATELET
Basophils Absolute: 0 10*3/uL (ref 0.0–0.2)
Basos: 0 %
EOS (ABSOLUTE): 0.3 10*3/uL (ref 0.0–0.4)
EOS: 4 %
HEMATOCRIT: 40.6 % (ref 34.0–46.6)
HEMOGLOBIN: 13.7 g/dL (ref 11.1–15.9)
IMMATURE GRANS (ABS): 0 10*3/uL (ref 0.0–0.1)
Immature Granulocytes: 0 %
LYMPHS ABS: 2.4 10*3/uL (ref 0.7–3.1)
LYMPHS: 31 %
MCH: 29 pg (ref 26.6–33.0)
MCHC: 33.7 g/dL (ref 31.5–35.7)
MCV: 86 fL (ref 79–97)
MONOCYTES: 8 %
Monocytes Absolute: 0.6 10*3/uL (ref 0.1–0.9)
NEUTROS ABS: 4.4 10*3/uL (ref 1.4–7.0)
Neutrophils: 57 %
Platelets: 324 10*3/uL (ref 150–379)
RBC: 4.72 x10E6/uL (ref 3.77–5.28)
RDW: 14.1 % (ref 12.3–15.4)
WBC: 7.7 10*3/uL (ref 3.4–10.8)

## 2017-02-19 MED ORDER — BUPROPION HCL ER (XL) 300 MG PO TB24: 300.0000 mg | ORAL_TABLET | Freq: Every day | ORAL | 2 refills | Status: DC

## 2017-02-19 NOTE — Progress Notes (Signed)
BP 113/79   Pulse 74   Temp 97.6 F (36.4 C) (Oral)   Ht _0  (1.549 m)   Wt 221 lb (100.2 kg)   BMI 41.76 kg/m    Subjective:    Patient ID: Angel Zimmerman, female    DOB: 1985/12/29, 31 y.o.   MRN: [id number]  HPI: Angel Zimmerman is a 31 y.o. female presenting on 02/19/2017 for Labwork (patient is fasting) and Smoking cessation followup (patient is still fasting, has cut back some, taking Wellbutrin sporadically)   HPI Well adult exam and fasting labs Patient is coming in for well adult exam and fasting labs. She was going do her Pap smear but she is on her period today so she will hold off and do that at a later date. She is still working on her smoking cessation and the Wellbutrin is helping some. She denies any major health issues except for some intermittent pain in her left medial knee. She says she gets it more when she does prolonged walking or going upstairs. She does not have as many issues going downstairs. She denies any fevers or chills redness or warmth. The pain is not there presently today but bothers her at least 3 or 4 days out of the week. It has been hurting more and more frequently recently. Patient denies any chest pain, shortness of breath, headaches or vision issues, abdominal complaints, diarrhea, nausea, vomiting.   Relevant past medical, surgical, family and social history reviewed and updated as indicated. Interim medical history since our last visit reviewed. Allergies and medications reviewed and updated.  Review of Systems  Constitutional: Negative for chills and fever.  HENT: Negative for ear pain and tinnitus.   Eyes: Negative for pain.  Respiratory: Negative for cough, shortness of breath and wheezing.   Cardiovascular: Negative for chest pain, palpitations and leg swelling.  Gastrointestinal: Negative for abdominal pain, blood in stool, constipation and diarrhea.  Genitourinary: Negative for dysuria and hematuria.  Musculoskeletal: Positive  for arthralgias. Negative for back pain, gait problem, joint swelling and myalgias.  Skin: Negative for color change and rash.  Neurological: Negative for dizziness, weakness and headaches.  Psychiatric/Behavioral: Negative for suicidal ideas.    Per HPI unless specifically indicated above   Allergies as of 02/19/2017   No Known Allergies     Medication List       Accurate as of 02/19/17 11:29 AM. Always use your most recent med list.          buPROPion 150 MG 24 hr tablet Commonly known as:  WELLBUTRIN XL Take 1 tablet (150 mg total) by mouth daily.          Objective:    BP 113/79   Pulse 74   Temp 97.6 F (36.4 C) (Oral)   Ht _1  (1.549 m)   Wt 221 lb (100.2 kg)   BMI 41.76 kg/m   Wt Readings from Last 3 Encounters:  02/19/17 221 lb (100.2 kg)  12/25/16 229 lb (103.9 kg)  05/19/13 226 lb (102.5 kg)    Physical Exam  Constitutional: She is oriented to person, place, and time. She appears well-developed and well-nourished. No distress.  Eyes: Conjunctivae are normal.  Cardiovascular: Normal rate, regular rhythm, normal heart sounds and intact distal pulses.   No murmur heard. Pulmonary/Chest: Effort normal and breath sounds normal. No respiratory distress. She has no wheezes. She has no rales.  Musculoskeletal: Normal range of motion. She exhibits no edema or tenderness (No  tenderness on exam in left knee, no joint laxity or signs of MCL or anterior cruciate ligament or LCL damage. No signs of meniscal damage either.).  Neurological: She is alert and oriented to person, place, and time. Coordination normal.  Skin: Skin is warm and dry. No rash noted. She is not diaphoretic.  Psychiatric: She has a normal mood and affect. Her behavior is normal.  Nursing note and vitals reviewed.     Assessment & Plan:   Problem List Items Addressed This Visit    None    Visit Diagnoses    Well adult exam    -  Primary   Relevant Orders   CBC with Differential/Platelet    Lipid panel   CMP14+EGFR   Needs smoking cessation education       Relevant Medications   buPROPion (WELLBUTRIN XL) 300 MG 24 hr tablet   Other Relevant Orders   CBC with Differential/Platelet   Lipid screening       Relevant Orders   Lipid panel   Diabetes mellitus screening       Relevant Orders   CMP14+EGFR   Patellofemoral syndrome of left knee       No pain on exam, history sounds like patellofemoral syndrome, will give exercises       Follow up plan: Return if symptoms worsen or fail to improve.  Counseling provided for all of the vaccine components Orders Placed This Encounter  Procedures  . CBC with Differential/Platelet  . Lipid panel  . [place] Marva Hendryx, MD [facility] 02/19/2017, 11:29 AM

## 2017-02-19 NOTE — Patient Instructions (Signed)
Patellofemoral Pain Syndrome Patellofemoral pain syndrome is a condition that involves a softening or breakdown of the tissue (cartilage) on the underside of your kneecap (patella). This causes pain in the front of the knee. The condition is also called runner's knee or chondromalacia patella. Patellofemoral pain syndrome is most common in young adults who are active in sports. Your knee is the largest joint in your body. The patella covers the front of your knee and is attached to muscles above and below your knee. The underside of the patella is covered with a smooth type of cartilage (synovium). The smooth surface helps the patella glide easily when you move your knee. Patellofemoral pain syndrome causes swelling in the joint linings and bone surfaces in your knee. What are the causes? Patellofemoral pain syndrome can be caused by:  Overuse.  Poor alignment of your knee joints.  Weak leg muscles.  A direct blow to your kneecap.  What increases the risk? You may be at risk for patellofemoral pain syndrome if you:  Do a lot of activities that can wear down your kneecap. These include: ? Running. ? Squatting. ? Climbing stairs.  Start a new physical activity or exercise program.  Wear shoes that do not fit well.  Do not have good leg strength.  Are overweight.  What are the signs or symptoms? Knee pain is the most common symptom of patellofemoral pain syndrome. This may feel like a dull, aching pain underneath your patella, in the front of your knee. There may be a popping or cracking sound when you move your knee. Pain may get worse with:  Exercise.  Climbing stairs.  Running.  Jumping.  Squatting.  Kneeling.  Sitting for a long time.  Moving or pushing on your patella.  How is this diagnosed? Your health care provider may be able to diagnose patellofemoral pain syndrome from your symptoms and medical history. You may be asked about your recent physical activities  and which ones cause knee pain. Your health care provider may do a physical exam with certain tests to confirm the diagnosis. These may include:  Moving your patella back and forth.  Checking your range of knee motion.  Having you squat or jump to see if you have pain.  Checking the strength of your leg muscles.  An MRI of the knee may also be done. How is this treated? Patellofemoral pain syndrome can usually be treated at home with rest, ice, compression, and elevation (RICE). Other treatments may include:  Nonsteroidal anti-inflammatory drugs (NSAIDs).  Physical therapy to stretch and strengthen your leg muscles.  Shoe inserts (orthotics) to take stress off your knee.  A knee brace or knee support.  Surgery to remove damaged cartilage or move the patella to a better position. The need for surgery is rare.  Follow these instructions at home:  Take medicines only as directed by your health care provider.  Rest your knee. ? When resting, keep your knee raised above the level of your heart. ? Avoid activities that cause knee pain.  Apply ice to the injured area: ? Put ice in a plastic bag. ? Place a towel between your skin and the bag. ? Leave the ice on for 20 minutes, 2-3 times a day.  Use splints, braces, knee supports, or walking aids as directed by your health care provider.  Perform stretching and strengthening exercises as directed by your health care provider or physical therapist.  Keep all follow-up visits as directed by your health care   provider. This is important. Contact a health care provider if:  Your symptoms get worse.  You are not improving with home care. This information is not intended to replace advice given to you by your health care provider. Make sure you discuss any questions you have with your health care provider. Document Released: 08/13/2009 Document Revised: 01/31/2016 Document Reviewed: 11/14/2013 Elsevier Interactive Patient Education   2018 [employer]  

## 2017-03-04 ENCOUNTER — Other Ambulatory Visit: Payer: 59 | Admitting: Family Medicine

## 2017-04-27 ENCOUNTER — Encounter: Payer: Self-pay | Admitting: Family Medicine

## 2017-04-27 ENCOUNTER — Ambulatory Visit (INDEPENDENT_AMBULATORY_CARE_PROVIDER_SITE_OTHER): Payer: 59 | Admitting: Family Medicine

## 2017-04-27 VITALS — BP 121/66 | HR 61 | Temp 97.2°F | Ht 61.0 in | Wt 211.0 lb

## 2017-04-27 DIAGNOSIS — N760 Acute vaginitis: Secondary | ICD-10-CM

## 2017-04-27 DIAGNOSIS — B9689 Other specified bacterial agents as the cause of diseases classified elsewhere: Secondary | ICD-10-CM

## 2017-04-27 DIAGNOSIS — Z01419 Encounter for gynecological examination (general) (routine) without abnormal findings: Secondary | ICD-10-CM | POA: Diagnosis not present

## 2017-04-27 DIAGNOSIS — F411 Generalized anxiety disorder: Secondary | ICD-10-CM | POA: Insufficient documentation

## 2017-04-27 DIAGNOSIS — F17200 Nicotine dependence, unspecified, uncomplicated: Secondary | ICD-10-CM

## 2017-04-27 LAB — WET PREP FOR TRICH, YEAST, CLUE
CLUE CELL EXAM: POSITIVE — AB
Trichomonas Exam: NEGATIVE
Yeast Exam: NEGATIVE

## 2017-04-27 MED ORDER — METRONIDAZOLE 500 MG PO TABS: 500.0000 mg | ORAL_TABLET | Freq: Two times a day (BID) | ORAL | 0 refills | Status: DC

## 2017-04-27 NOTE — Progress Notes (Signed)
BP 121/66   Pulse 61   Temp (!) 97.2 F (36.2 C) (Oral)   Ht 5\' 1"  (1.549 m)   Wt 211 lb (95.7 kg)   LMP 04/20/2017 (Approximate)   BMI 39.87 kg/m    Subjective:    Patient ID: Angel Zimmerman, female    DOB: 1985/11/25, 31 y.o.   MRN: [id number]  HPI: Angel Zimmerman is a 31 y.o. female presenting on 04/27/2017 for Gynecologic Exam   HPI Well woman exam and Pap smear Patient is coming in today for well woman exam and Pap smear. She is in a current homosexual relationship and has been married for 6 years to her wife. She denies any risks for STDs and does not need birth control because she is in relation only with a female. She denies any breast issues or changes. She does have vaginal discharge which she attempted to treat for yeast last week and improved but now has worsened again. She denies any vaginal or pelvic pain. She is using Wellbutrin and has reduced the amount that she is smoking and is now down to half a pack per day and her wife is encouraging her to continue. Patient denies any chest pain, shortness of breath, headaches or vision issues, abdominal complaints, diarrhea, nausea, vomiting, or joint issues.   Relevant past medical, surgical, family and social history reviewed and updated as indicated. Interim medical history since our last visit reviewed. Allergies and medications reviewed and updated.  Review of Systems  Constitutional: Negative for chills and fever.  HENT: Negative for congestion, ear discharge, ear pain and tinnitus.   Eyes: Negative for pain, redness and visual disturbance.  Respiratory: Negative for cough, chest tightness, shortness of breath and wheezing.   Cardiovascular: Negative for chest pain, palpitations and leg swelling.  Gastrointestinal: Negative for abdominal pain, blood in stool, constipation and diarrhea.  Genitourinary: Positive for vaginal discharge. Negative for decreased urine volume, difficulty urinating, dysuria, flank pain,  hematuria, pelvic pain, vaginal bleeding and vaginal pain.  Musculoskeletal: Negative for back pain, gait problem and myalgias.  Skin: Negative for rash.  Neurological: Negative for dizziness, weakness, light-headedness and headaches.  Psychiatric/Behavioral: Negative for agitation, behavioral problems and suicidal ideas.  All other systems reviewed and are negative.   Per HPI unless specifically indicated above     Objective:    BP 121/66   Pulse 61   Temp (!) 97.2 F (36.2 C) (Oral)   Ht 5\' 1"  (1.549 m)   Wt 211 lb (95.7 kg)   LMP 04/20/2017 (Approximate)   BMI 39.87 kg/m   Wt Readings from Last 3 Encounters:  04/27/17 211 lb (95.7 kg)  02/19/17 221 lb (100.2 kg)  12/25/16 229 lb (103.9 kg)    Physical Exam  Constitutional: She is oriented to person, place, and time. She appears well-developed and well-nourished. No distress.  Eyes: Conjunctivae are normal.  Neck: Neck supple. No thyromegaly present.  Cardiovascular: Normal rate, regular rhythm, normal heart sounds and intact distal pulses.   No murmur heard. Pulmonary/Chest: Effort normal and breath sounds normal. No respiratory distress. She has no wheezes. She has no rales. Right breast exhibits no inverted nipple, no mass, no nipple discharge, no skin change and no tenderness. Left breast exhibits no inverted nipple, no mass, no nipple discharge, no skin change and no tenderness. Breasts are symmetrical.  Abdominal: Soft. Bowel sounds are normal. She exhibits no distension. There is no tenderness. There is no rebound and no guarding.  Genitourinary:  Vagina normal and uterus normal. No breast swelling, tenderness, discharge or bleeding. Pelvic exam was performed with patient supine. There is no rash or lesion on the right labia. There is no rash or lesion on the left labia. Uterus is not deviated, not enlarged, not fixed and not tender. Cervix exhibits no motion tenderness, no discharge and no friability. Right adnexum  displays no mass and no tenderness. Left adnexum displays no mass and no tenderness.  Musculoskeletal: Normal range of motion. She exhibits no edema.  Lymphadenopathy:    She has no cervical adenopathy.    She has no axillary adenopathy.  Neurological: She is alert and oriented to person, place, and time. Coordination normal.  Skin: Skin is warm and dry. No rash noted. She is not diaphoretic.  Psychiatric: She has a normal mood and affect. Her behavior is normal.  Vitals reviewed.  Wet prep: Positive for clue cells    Assessment & Plan:   Problem List Items Addressed This Visit    None    Visit Diagnoses    Well woman exam with routine gynecological exam    -  Primary   Relevant Orders   Pap IG, rfx HPV all pth (Completed)   Bacterial vaginosis       Relevant Medications   metroNIDAZOLE (FLAGYL) 500 MG tablet   Other Relevant Orders   WET PREP FOR TRICH, YEAST, CLUE (Completed)   Needs smoking cessation education       Patient is still taking Wellbutrin and has reduced the amount that she smokes, she is now down to half pack a day       Follow up plan: Return if symptoms worsen or fail to improve.  Counseling provided for all of the vaccine components No orders of the defined types were placed in this encounter.   Arville Care, MD [facility] 04/27/2017, 3:15 PM

## 2017-04-28 LAB — PAP IG, RFX HPV ALL PTH: PAP SMEAR COMMENT: 0

## 2017-04-29 ENCOUNTER — Encounter: Payer: Self-pay | Admitting: *Deleted

## 2018-01-29 ENCOUNTER — Encounter: Payer: Self-pay | Admitting: Family Medicine

## 2018-01-29 ENCOUNTER — Ambulatory Visit: Payer: No Typology Code available for payment source | Admitting: Family Medicine

## 2018-01-29 VITALS — BP 125/84 | HR 70 | Temp 97.4°F | Ht 61.0 in | Wt 215.0 lb

## 2018-01-29 DIAGNOSIS — K2100 Gastro-esophageal reflux disease with esophagitis, without bleeding: Secondary | ICD-10-CM

## 2018-01-29 MED ORDER — GI COCKTAIL ~~LOC~~: 30.0000 mL | Freq: Once | ORAL | Status: DC

## 2018-01-29 MED ORDER — OMEPRAZOLE 20 MG PO CPDR: 20.0000 mg | DELAYED_RELEASE_CAPSULE | Freq: Every day | ORAL | 3 refills | Status: DC

## 2018-01-29 NOTE — Progress Notes (Signed)
BP 125/84   Pulse 70   Temp (!) 97.4 F (36.3 C) (Oral)   Ht  (1.549 m)   Wt 215 lb (97.5 kg)   BMI 40.62 kg/m    Subjective:    Patient ID: Angel Zimmerman, female    DOB: [birth year], 32 y.o.   MRN: [id number]  HPI: Angel Zimmerman is a 32 y.o. female presenting on 01/29/2018 for Abdominal Pain (x 1 month, pain causes her to be nauseous, some vomiting, acid reflux, more formed stools, pain has gotten worse in the last month; history of frequent use of Goody Powders, up to 4 per day)   HPI Abdominal pain Patient is coming in with complaints of abdominal pain and nausea and one episode of vomiting that is been going on for about a month.  She says she has been using a lot of Goody powders.  She says it will pain her in the morning and in the evenings before she eats and then will hurt for a little bit after she eats as well.  She says usually throughout the day she is better but is usually in the morning in the evenings.  She said it starts out as a hunger pain and then turns into a burning and sharp pain.  Has been worsening over the past month and has been happening more frequently and more severely.  She denies any diarrhea or constipation or blood in her stool.  Relevant past medical, surgical, family and social history reviewed and updated as indicated. Interim medical history since our last visit reviewed. Allergies and medications reviewed and updated.  Review of Systems  Constitutional: Negative for chills and fever.  Eyes: Negative for visual disturbance.  Respiratory: Negative for chest tightness and shortness of breath.   Cardiovascular: Negative for chest pain and leg swelling.  Gastrointestinal: Positive for abdominal pain, nausea and vomiting. Negative for blood in stool, constipation and diarrhea.  Musculoskeletal: Negative for back pain and gait problem.  Skin: Negative for rash.  Neurological: Negative for light-headedness and headaches.    Psychiatric/Behavioral: Negative for agitation and behavioral problems.  All other systems reviewed and are negative.   Per HPI unless specifically indicated above   Allergies as of 01/29/2018   No Known Allergies     Medication List        Accurate as of 01/29/18  8:43 AM. Always use your most recent med list.          omeprazole 20 MG capsule Commonly known as:  PRILOSEC Take 1 capsule (20 mg total) by mouth daily.          Objective:    BP 125/84   Pulse 70   Temp (!) 97.4 F (36.3 C) (Oral)   Ht  (1.549 m)   Wt 215 lb (97.5 kg)   BMI 40.62 kg/m   Wt Readings from Last 3 Encounters:  01/29/18 215 lb (97.5 kg)  04/27/17 211 lb (95.7 kg)  02/19/17 221 lb (100.2 kg)    Physical Exam  Constitutional: She is oriented to person, place, and time. She appears well-developed and well-nourished. No distress.  Eyes: Conjunctivae are normal.  Cardiovascular: Normal rate, regular rhythm, normal heart sounds and intact distal pulses.  No murmur heard. Pulmonary/Chest: Effort normal and breath sounds normal. No respiratory distress. She has no wheezes.  Abdominal: Bowel sounds are normal. There is tenderness in the epigastric area. There is no rigidity and no guarding.  Musculoskeletal: Normal range of  motion. She exhibits no edema or tenderness.  Neurological: She is alert and oriented to person, place, and time. Coordination normal.  Skin: Skin is warm and dry. No rash noted. She is not diaphoretic.  Psychiatric: She has a normal mood and affect. Her behavior is normal.  Nursing note and vitals reviewed.       Assessment & Plan:   Problem List Items Addressed This Visit    None    Visit Diagnoses    Gastroesophageal reflux disease with esophagitis    -  Primary   Concern for possible stomach ulcer   Relevant Medications   gi cocktail (Maalox,Lidocaine,Donnatal)   omeprazole (PRILOSEC) 20 MG capsule       Follow up plan: Return if symptoms worsen or  fail to improve.  Counseling provided for all of the vaccine components No orders of the defined types were placed in this encounter.   Arville Care, MD [facility] 01/29/2018, 8:43 AM

## 2018-04-28 ENCOUNTER — Encounter: Payer: Self-pay | Admitting: Family Medicine

## 2018-04-28 ENCOUNTER — Ambulatory Visit (INDEPENDENT_AMBULATORY_CARE_PROVIDER_SITE_OTHER): Payer: No Typology Code available for payment source | Admitting: Family Medicine

## 2018-04-28 VITALS — BP 113/69 | HR 57 | Temp 97.2°F | Ht 61.0 in | Wt 215.8 lb

## 2018-04-28 DIAGNOSIS — Z716 Tobacco abuse counseling: Secondary | ICD-10-CM

## 2018-04-28 DIAGNOSIS — Z01419 Encounter for gynecological examination (general) (routine) without abnormal findings: Secondary | ICD-10-CM

## 2018-04-28 DIAGNOSIS — R102 Pelvic and perineal pain: Secondary | ICD-10-CM

## 2018-04-28 LAB — CBC WITH DIFFERENTIAL/PLATELET
BASOS ABS: 0 10*3/uL (ref 0.0–0.2)
BASOS: 0 %
EOS (ABSOLUTE): 0.4 10*3/uL (ref 0.0–0.4)
EOS: 4 %
Hematocrit: 42 % (ref 34.0–46.6)
Hemoglobin: 13.6 g/dL (ref 11.1–15.9)
IMMATURE GRANS (ABS): 0 10*3/uL (ref 0.0–0.1)
Immature Granulocytes: 0 %
LYMPHS: 31 %
Lymphocytes Absolute: 3 10*3/uL (ref 0.7–3.1)
MCH: 29.4 pg (ref 26.6–33.0)
MCHC: 32.4 g/dL (ref 31.5–35.7)
MCV: 91 fL (ref 79–97)
MONOCYTES: 12 %
Monocytes Absolute: 1.1 10*3/uL — ABNORMAL HIGH (ref 0.1–0.9)
NEUTROS ABS: 5.3 10*3/uL (ref 1.4–7.0)
Neutrophils: 53 %
Platelets: 301 10*3/uL (ref 150–450)
RBC: 4.62 x10E6/uL (ref 3.77–5.28)
RDW: 14.5 % (ref 12.3–15.4)
WBC: 9.9 10*3/uL (ref 3.4–10.8)

## 2018-04-28 LAB — CMP14+EGFR
ALBUMIN: 4 g/dL (ref 3.5–5.5)
ALT: 6 IU/L (ref 0–32)
AST: 13 IU/L (ref 0–40)
Albumin/Globulin Ratio: 1.9 (ref 1.2–2.2)
Alkaline Phosphatase: 46 IU/L (ref 39–117)
BILIRUBIN TOTAL: 0.2 mg/dL (ref 0.0–1.2)
BUN / CREAT RATIO: 17 (ref 9–23)
BUN: 11 mg/dL (ref 6–20)
CHLORIDE: 105 mmol/L (ref 96–106)
CO2: 22 mmol/L (ref 20–29)
CREATININE: 0.63 mg/dL (ref 0.57–1.00)
Calcium: 9.1 mg/dL (ref 8.7–10.2)
GFR calc Af Amer: 137 mL/min/{1.73_m2} (ref >59)
GFR calc non Af Amer: 119 mL/min/{1.73_m2} (ref >59)
GLUCOSE: 77 mg/dL (ref 65–99)
Globulin, Total: 2.1 g/dL (ref 1.5–4.5)
Potassium: 4.1 mmol/L (ref 3.5–5.2)
Sodium: 140 mmol/L (ref 134–144)
TOTAL PROTEIN: 6.1 g/dL (ref 6.0–8.5)

## 2018-04-28 LAB — URINALYSIS, COMPLETE
Bilirubin, UA: NEGATIVE
Glucose, UA: NEGATIVE
Ketones, UA: NEGATIVE
LEUKOCYTES UA: NEGATIVE
Nitrite, UA: NEGATIVE
PH UA: 6 (ref 5.0–7.5)
Protein, UA: NEGATIVE
RBC UA: NEGATIVE
Specific Gravity, UA: 1.025 (ref 1.005–1.030)
Urobilinogen, Ur: 0.2 mg/dL (ref 0.2–1.0)

## 2018-04-28 LAB — LIPID PANEL
Chol/HDL Ratio: 3.4 ratio (ref 0.0–4.4)
Cholesterol, Total: 145 mg/dL (ref 100–199)
HDL: 43 mg/dL (ref >39)
LDL CALC: 80 mg/dL (ref 0–99)
Triglycerides: 111 mg/dL (ref 0–149)
VLDL CHOLESTEROL CAL: 22 mg/dL (ref 5–40)

## 2018-04-28 LAB — MICROSCOPIC EXAMINATION
Bacteria, UA: NONE SEEN
RBC MICROSCOPIC, UA: NONE SEEN /HPF (ref 0–2)
Renal Epithel, UA: NONE SEEN /hpf

## 2018-04-28 MED ORDER — NICOTINE 21 MG/24HR TD PT24: 21.0000 mg | MEDICATED_PATCH | Freq: Every day | TRANSDERMAL | 0 refills | Status: DC

## 2018-04-28 NOTE — Progress Notes (Signed)
BP 113/69   Pulse (!) 57   Temp (!) 97.2 F (36.2 C) (Oral)   Ht 5' 1"  (1.549 m)   Wt 215 lb 12.8 oz (97.9 kg)   LMP 04/17/2018 (Exact Date)   BMI 40.78 kg/m    Subjective:    Patient ID: Angel Zimmerman, female    DOB: 09-Jul-1986, 32 y.o.   MRN: [id number]  HPI: Angel Zimmerman is a 32 y.o. female presenting on 04/28/2018 for Gynecologic Exam   HPI Well woman exam and gynecological exam Patient is coming in today for well woman exam and gynecological exam.  She has had normal exams over the past 5 years, she has had 2 normals and this will be her third and will space her out.  She denies any breast issues or tenderness or discharge or masses.  She has been having some right lower pelvic pain that just started since her last cycle a week and a half ago and it was quite sharp and intense at first and it has improved since but she still has a little bit her right lower pelvic pain and pain with intercourse with her wife, she says that finger insertion and manual stimulation is somewhat painful still.  She has had a similar one many years ago but has not had any until just recently.  She denies any dysuria or blood in her urine or frequency.  She denies any more vaginal discharge than she normally has but just the pain in the right lower pelvis.  Patient wants to quit smoking and wants to try nicotine patches. Patient denies any chest pain, shortness of breath, headaches or vision issues, diarrhea, nausea, vomiting, or joint issues.   Relevant past medical, surgical, family and social history reviewed and updated as indicated. Interim medical history since our last visit reviewed. Allergies and medications reviewed and updated.  Review of Systems  Constitutional: Negative for chills and fever.  HENT: Negative for ear pain and tinnitus.   Eyes: Negative for pain and visual disturbance.  Respiratory: Negative for cough, chest tightness, shortness of breath and wheezing.     Cardiovascular: Negative for chest pain, palpitations and leg swelling.  Gastrointestinal: Negative for abdominal pain, blood in stool, constipation, diarrhea, nausea and vomiting.  Genitourinary: Positive for pelvic pain and vaginal pain. Negative for difficulty urinating, dysuria, flank pain, frequency, hematuria, vaginal bleeding and vaginal discharge.  Musculoskeletal: Negative for back pain, gait problem and myalgias.  Skin: Negative for rash.  Neurological: Negative for dizziness, weakness, light-headedness and headaches.  Psychiatric/Behavioral: Negative for agitation, behavioral problems and suicidal ideas.  All other systems reviewed and are negative.   Per HPI unless specifically indicated above   Allergies as of 04/28/2018   No Known Allergies     Medication List        Accurate as of 04/28/18 10:29 AM. Always use your most recent med list.          nicotine 21 mg/24hr patch Commonly known as:  NICODERM CQ - dosed in mg/24 hours Place 1 patch (21 mg total) onto the skin daily.   omeprazole 20 MG capsule Commonly known as:  PRILOSEC Take 1 capsule (20 mg total) by mouth daily.          Objective:    BP 113/69   Pulse (!) 57   Temp (!) 97.2 F (36.2 C) (Oral)   Ht 5' 1"  (1.549 m)   Wt 215 lb 12.8 oz (97.9 kg)   LMP  04/17/2018 (Exact Date)   BMI 40.78 kg/m   Wt Readings from Last 3 Encounters:  04/28/18 215 lb 12.8 oz (97.9 kg)  01/29/18 215 lb (97.5 kg)  04/27/17 211 lb (95.7 kg)    Physical Exam  Constitutional: She is oriented to person, place, and time. She appears well-developed and well-nourished. No distress.  Eyes: Conjunctivae are normal.  Neck: Neck supple. No thyromegaly present.  Cardiovascular: Normal rate, regular rhythm, normal heart sounds and intact distal pulses.  No murmur heard. Pulmonary/Chest: Effort normal and breath sounds normal. No respiratory distress. She has no wheezes. Right breast exhibits no inverted nipple, no mass,  no nipple discharge, no skin change and no tenderness. Left breast exhibits no inverted nipple, no mass, no nipple discharge, no skin change and no tenderness. No breast tenderness, discharge or bleeding. Breasts are symmetrical.  Abdominal: Soft. Bowel sounds are normal. She exhibits no distension. There is no tenderness. There is no rebound and no guarding. Hernia confirmed negative in the right inguinal area and confirmed negative in the left inguinal area.  Genitourinary: Vagina normal and uterus normal. No breast tenderness, discharge or bleeding. Pelvic exam was performed with patient supine. There is no rash or lesion on the right labia. There is no rash or lesion on the left labia. Uterus is not deviated, not enlarged, not fixed and not tender. Cervix exhibits no motion tenderness, no discharge and no friability. Right adnexum displays tenderness. Right adnexum displays no mass and no fullness. Left adnexum displays no mass, no tenderness and no fullness. No tenderness in the vagina.  Musculoskeletal: Normal range of motion. She exhibits no edema.  Lymphadenopathy:    She has no cervical adenopathy.    She has no axillary adenopathy. No inguinal adenopathy noted on the right or left side.  Neurological: She is alert and oriented to person, place, and time. Coordination normal.  Skin: Skin is warm and dry. No rash noted. She is not diaphoretic.  Psychiatric: She has a normal mood and affect. Her behavior is normal.  Nursing note and vitals reviewed.       Assessment & Plan:   Problem List Items Addressed This Visit      Other   Morbid obesity (Elias-Fela Solis)    Other Visit Diagnoses    Well woman exam with routine gynecological exam    -  Primary   Relevant Orders   CBC with Differential/Platelet   CMP14+EGFR   Lipid panel   Pap IG, rfx HPV all pth   Pelvic pain in female       Relevant Orders   Urinalysis, Complete   Urine Culture   US PELVIC COMPLETE WITH TRANSVAGINAL   Encounter for  smoking cessation counseling       Relevant Medications   nicotine (NICODERM CQ - DOSED IN MG/24 HOURS) 21 mg/24hr patch    Because of pelvic tenderness, will send patient for ultrasound and get a urinalysis, likely a cyst causing the problems but will check it out because she is still having pain.  Patient would like to quit smoking and sent nicotine patches.  Follow up plan: Return in about 1 year (around 04/29/2019), or if symptoms worsen or fail to improve, for Well adult exam.  Counseling provided for all of the vaccine components Orders Placed This Encounter  Procedures  . Urine Culture  . US PELVIC COMPLETE WITH TRANSVAGINAL  . Urinalysis, Complete  . CBC with Differential/Platelet  . CMP14+EGFR  . Lipid panel  Caryl Pina, MD [facility] 04/28/2018, 10:29 AM

## 2018-04-29 LAB — URINE CULTURE: Organism ID, Bacteria: NO GROWTH

## 2018-05-01 LAB — PAP IG, RFX HPV ALL PTH: PAP Smear Comment: 0

## 2018-05-05 ENCOUNTER — Ambulatory Visit (HOSPITAL_COMMUNITY)
Admission: RE | Admit: 2018-05-05 | Discharge: 2018-05-05 | Disposition: A | Discharge status: Home / Self Care | Payer: No Typology Code available for payment source | Source: Ambulatory Visit | Attending: Family Medicine | Admitting: Family Medicine

## 2018-05-05 DIAGNOSIS — R102 Pelvic and perineal pain: Secondary | ICD-10-CM | POA: Diagnosis not present

## 2018-05-05 DIAGNOSIS — N83201 Unspecified ovarian cyst, right side: Secondary | ICD-10-CM | POA: Insufficient documentation

## 2018-05-13 ENCOUNTER — Other Ambulatory Visit: Payer: Self-pay | Admitting: Family Medicine

## 2018-05-13 DIAGNOSIS — N83201 Unspecified ovarian cyst, right side: Secondary | ICD-10-CM

## 2018-07-09 ENCOUNTER — Ambulatory Visit (HOSPITAL_COMMUNITY): Payer: No Typology Code available for payment source

## 2018-07-21 ENCOUNTER — Other Ambulatory Visit: Payer: Self-pay | Admitting: Family Medicine

## 2018-07-21 DIAGNOSIS — Z716 Tobacco abuse counseling: Secondary | ICD-10-CM

## 2018-07-26 ENCOUNTER — Ambulatory Visit: Payer: No Typology Code available for payment source | Admitting: Nurse Practitioner

## 2018-07-26 ENCOUNTER — Encounter: Payer: Self-pay | Admitting: Nurse Practitioner

## 2018-07-26 VITALS — BP 123/73 | HR 60 | Temp 97.2°F | Ht 61.0 in | Wt 228.0 lb

## 2018-07-26 DIAGNOSIS — J029 Acute pharyngitis, unspecified: Secondary | ICD-10-CM

## 2018-07-26 DIAGNOSIS — J069 Acute upper respiratory infection, unspecified: Secondary | ICD-10-CM

## 2018-07-26 MED ORDER — AMOXICILLIN-POT CLAVULANATE 875-125 MG PO TABS: 1.0000 | ORAL_TABLET | Freq: Two times a day (BID) | ORAL | 0 refills | Status: DC

## 2018-07-26 NOTE — Progress Notes (Signed)
   Subjective:    Patient ID: Angel HeckleVirginia R Zimmerman, female    DOB: December 30, 1985, 32 y.o.   MRN: [id number]   Chief Complaint: Cough   HPI Patient comes in today c/o cough, congestion ans sore throat. Seems to be worse in the mornings when she wakes up. No fever. Started over 7 days ago. No better. Has had to use ventolin inhaer every morning for the last week.   Review of Systems  Constitutional: Positive for chills. Negative for fever.  HENT: Positive for congestion, rhinorrhea, sinus pressure, sore throat and trouble swallowing.   Respiratory: Positive for cough.   Cardiovascular: Negative.   Gastrointestinal: Negative.   Neurological: Negative.   Psychiatric/Behavioral: Negative.   All other systems reviewed and are negative.      Objective:   Physical Exam  Constitutional: She is oriented to person, place, and time. She appears well-developed and well-nourished.  HENT:  Right Ear: Hearing, tympanic membrane, external ear and ear canal normal.  Left Ear: Hearing, tympanic membrane, external ear and ear canal normal.  Nose: Mucosal edema and rhinorrhea present. Right sinus exhibits no maxillary sinus tenderness and no frontal sinus tenderness. Left sinus exhibits no maxillary sinus tenderness and no frontal sinus tenderness.  Mouth/Throat: Uvula is midline and mucous membranes are normal. Posterior oropharyngeal erythema present.  Eyes: Pupils are equal, round, and reactive to light. EOM are normal.  Neck: Normal range of motion. Neck supple.  Cardiovascular: Normal rate and regular rhythm.  Pulmonary/Chest: Effort normal.  Neurological: She is alert and oriented to person, place, and time.  Skin: Skin is warm.  Psychiatric: She has a normal mood and affect. Her behavior is normal. Thought content normal.  Nursing note and vitals reviewed.   BP 123/73   Pulse 60   Temp (!) 97.2 F (36.2 C) (Oral)   Ht 5\' 1"  (1.549 m)   Wt 228 lb (103.4 kg)   BMI 43.08 kg/m          Assessment & Plan:  Angel Zimmerman in today with chief complaint of Cough   1. Pharyngitis, unspecified etiology  2. Upper respiratory infection with cough and congestion 1. Take meds as prescribed 2. Use a cool mist humidifier especially during the winter months and when heat has been humid. 3. Use saline nose sprays frequently 4. Saline irrigations of the nose can be very helpful if done frequently.  * 4X daily for 1 week*  * Use of a nettie pot can be helpful with this. Follow directions with this* 5. Drink plenty of fluids 6. Keep thermostat turn down low 7.For any cough or congestion  Use plain Mucinex- regular strength or max strength is fine   * Children- consult with Pharmacist for dosing 8. For fever or aces or pains- take tylenol or ibuprofen appropriate for age and weight.  * for fevers greater than 101 orally you may alternate ibuprofen and tylenol every  3 hours.   Meds ordered this encounter  Medications  . amoxicillin-clavulanate (AUGMENTIN) 875-125 MG tablet    Sig: Take 1 tablet by mouth 2 (two) times daily.    Dispense:  14 tablet    Refill:  0    Order Specific Question:   Supervising Provider    Answer:   Johna SheriffVINCENT, CAROL L [[id number]]   Mary-Margaret Daphine DeutscherMartin, FNP

## 2019-06-13 ENCOUNTER — Ambulatory Visit: Payer: No Typology Code available for payment source | Admitting: Family Medicine

## 2019-06-13 ENCOUNTER — Other Ambulatory Visit: Payer: Self-pay

## 2019-06-13 ENCOUNTER — Encounter: Payer: Self-pay | Admitting: Family Medicine

## 2019-06-13 VITALS — BP 126/81 | HR 75 | Temp 98.4°F | Resp 20 | Ht 61.0 in | Wt 208.0 lb

## 2019-06-13 DIAGNOSIS — W540XXA Bitten by dog, initial encounter: Secondary | ICD-10-CM

## 2019-06-13 DIAGNOSIS — Z8742 Personal history of other diseases of the female genital tract: Secondary | ICD-10-CM | POA: Diagnosis not present

## 2019-06-13 DIAGNOSIS — S81832A Puncture wound without foreign body, left lower leg, initial encounter: Secondary | ICD-10-CM

## 2019-06-13 MED ORDER — FLUCONAZOLE 150 MG PO TABS: ORAL_TABLET | ORAL | 0 refills | Status: DC

## 2019-06-13 MED ORDER — AMOXICILLIN-POT CLAVULANATE 875-125 MG PO TABS: 1.0000 | ORAL_TABLET | Freq: Two times a day (BID) | ORAL | 0 refills | Status: AC

## 2019-06-13 NOTE — Progress Notes (Signed)
Subjective:  Patient ID: Angel Zimmerman, female    DOB: Oct 28, 1985, 33 y.o.   MRN: [id number]  Patient Care Team: Dettinger, Fransisca Kaufmann, MD as PCP - General (Family Medicine)   Chief Complaint:  Animal Bite (left lower leg - 2 weeks ago)   HPI: Angel Zimmerman is a 33 y.o. female presenting on 06/13/2019 for Animal Bite (left lower leg - 2 weeks ago)   Pt presents today for a dog bite to her left shin. Pt states her friends dog bit her 2 weeks ago. States the dog's rabies vaccinations are up to date. She has been cleaning the wound daily. States over the last 5 days it has started swelling and becoming warm to touch. No drainage. No fever, chills, weakness, fatigue, or confusion. Td up to date.   Animal Bite  The incident occurred more than 2 days ago. The incident occurred at another residence. There is an injury to the left lower leg. The pain is mild. It is unlikely that a foreign body is present. Pertinent negatives include no chest pain, no abdominal pain, no bowel incontinence, no nausea, no vomiting, no headaches, no inability to bear weight, no neck pain, no pain when bearing weight, no focal weakness, no decreased responsiveness, no light-headedness, no loss of consciousness, no seizures, no tingling, no weakness, no cough, no difficulty breathing and no memory loss. There have been no prior injuries to these areas. Her tetanus status is UTD. She has been behaving normally. There were no sick contacts. She has received no recent medical care.    Relevant past medical, surgical, family, and social history reviewed and updated as indicated.  Allergies and medications reviewed and updated. Date reviewed: Chart in Epic.   Past Medical History:  Diagnosis Date  . Arthritis     Past Surgical History:  Procedure Laterality Date  . WISDOM TOOTH EXTRACTION      Social History   Socioeconomic History  . Marital status: Single    Spouse name: Not on file  . Number of  children: Not on file  . Years of education: Not on file  . Highest education level: Not on file  Occupational History  . Not on file  Social Needs  . Financial resource strain: Not on file  . Food insecurity    Worry: Not on file    Inability: Not on file  . Transportation needs    Medical: Not on file    Non-medical: Not on file  Tobacco Use  . Smoking status: Current Every Day Smoker    Packs/day: 0.50    Years: 10.00    Pack years: 5.00  . Smokeless tobacco: Never Used  Substance and Sexual Activity  . Alcohol use: Yes    Comment: social  . Drug use: No  . Sexual activity: Yes    Birth control/protection: None  Lifestyle  . Physical activity    Days per week: Not on file    Minutes per session: Not on file  . Stress: Not on file  Relationships  . Social Herbalist on phone: Not on file    Gets together: Not on file    Attends religious service: Not on file    Active member of club or organization: Not on file    Attends meetings of clubs or organizations: Not on file    Relationship status: Not on file  . Intimate partner violence    Fear of current or  ex partner: Not on file    Emotionally abused: Not on file    Physically abused: Not on file    Forced sexual activity: Not on file  Other Topics Concern  . Not on file  Social History Narrative  . Not on file    Outpatient Encounter Medications as of 06/13/2019  Medication Sig  . amoxicillin-clavulanate (AUGMENTIN) 875-125 MG tablet Take 1 tablet by mouth 2 (two) times daily for 10 days.  . fluconazole (DIFLUCAN) 150 MG tablet 1 po q week x 4 weeks  . [DISCONTINUED] amoxicillin-clavulanate (AUGMENTIN) 875-125 MG tablet Take 1 tablet by mouth 2 (two) times daily.  . [DISCONTINUED] NICODERM CQ 21 MG/24HR patch APPLY 1 PATCH TOPICALLY ONCE DAILY  . [DISCONTINUED] omeprazole (PRILOSEC) 20 MG capsule Take 1 capsule (20 mg total) by mouth daily.  . [DISCONTINUED] gi cocktail (Maalox,Lidocaine,Donnatal)     No facility-administered encounter medications on file as of 06/13/2019.     No Known Allergies  Review of Systems  Constitutional: Negative for activity change, appetite change, chills, decreased responsiveness, diaphoresis, fatigue, fever and unexpected weight change.  HENT: Negative.   Eyes: Negative.   Respiratory: Negative for cough, chest tightness and shortness of breath.   Cardiovascular: Negative for chest pain, palpitations and leg swelling.  Gastrointestinal: Negative for abdominal pain, blood in stool, bowel incontinence, constipation, diarrhea, nausea and vomiting.  Endocrine: Negative.   Genitourinary: Negative for decreased urine volume, difficulty urinating, dysuria, frequency and urgency.  Musculoskeletal: Negative for arthralgias, myalgias and neck pain.  Skin: Positive for color change and wound.  Allergic/Immunologic: Negative.   Neurological: Negative for dizziness, tingling, focal weakness, seizures, loss of consciousness, syncope, weakness, light-headedness and headaches.  Hematological: Negative.   Psychiatric/Behavioral: Negative for confusion, hallucinations, memory loss, sleep disturbance and suicidal ideas.  All other systems reviewed and are negative.       Objective:  BP 126/81   Pulse 75   Temp 98.4 F (36.9 C)   Resp 20   Ht 5' 1"  (1.549 m)   Wt 208 lb (94.3 kg)   LMP 05/29/2019   SpO2 100%   BMI 39.30 kg/m    Wt Readings from Last 3 Encounters:  06/13/19 208 lb (94.3 kg)  07/26/18 228 lb (103.4 kg)  04/28/18 215 lb 12.8 oz (97.9 kg)    Physical Exam Vitals signs and nursing note reviewed.  Constitutional:      General: She is not in acute distress.    Appearance: Normal appearance. She is well-developed and well-groomed. She is obese. She is not ill-appearing, toxic-appearing or diaphoretic.  HENT:     Head: Normocephalic and atraumatic.     Jaw: There is normal jaw occlusion.     Right Ear: Hearing normal.     Left Ear: Hearing  normal.     Nose: Nose normal.     Mouth/Throat:     Lips: Pink.     Mouth: Mucous membranes are moist.     Pharynx: Oropharynx is clear. Uvula midline.  Eyes:     General: Lids are normal.     Extraocular Movements: Extraocular movements intact.     Conjunctiva/sclera: Conjunctivae normal.     Pupils: Pupils are equal, round, and reactive to light.  Neck:     Musculoskeletal: Normal range of motion and neck supple.     Thyroid: No thyroid mass, thyromegaly or thyroid tenderness.     Vascular: No carotid bruit or JVD.     Trachea: Trachea and phonation normal.  Cardiovascular:     Rate and Rhythm: Normal rate and regular rhythm.     Chest Wall: PMI is not displaced.     Pulses: Normal pulses.     Heart sounds: Normal heart sounds. No murmur. No friction rub. No gallop.   Pulmonary:     Effort: Pulmonary effort is normal. No respiratory distress.     Breath sounds: Normal breath sounds. No wheezing.  Abdominal:     General: Bowel sounds are normal. There is no distension or abdominal bruit.     Palpations: Abdomen is soft. There is no hepatomegaly or splenomegaly.     Tenderness: There is no abdominal tenderness. There is no right CVA tenderness or left CVA tenderness.     Hernia: No hernia is present.  Musculoskeletal: Normal range of motion.     Right lower leg: No edema.     Left lower leg: No edema.  Lymphadenopathy:     Cervical: No cervical adenopathy.  Skin:    General: Skin is warm and dry.     Capillary Refill: Capillary refill takes less than 2 seconds.     Coloration: Skin is not cyanotic, jaundiced or pale.     Findings: No rash.       Neurological:     General: No focal deficit present.     Mental Status: She is alert and oriented to person, place, and time.     Cranial Nerves: Cranial nerves are intact.     Sensory: Sensation is intact.     Motor: Motor function is intact.     Coordination: Coordination is intact.     Gait: Gait is intact.     Deep Tendon  Reflexes: Reflexes are normal and symmetric.  Psychiatric:        Attention and Perception: Attention and perception normal.        Mood and Affect: Mood and affect normal.        Speech: Speech normal.        Behavior: Behavior normal. Behavior is cooperative.        Thought Content: Thought content normal.        Cognition and Memory: Cognition and memory normal.        Judgment: Judgment normal.     Results for orders placed or performed in visit on 04/28/18  Urine Culture   Specimen: Urine   URINE  Result Value Ref Range   Urine Culture, Routine Final report    Organism ID, Bacteria No growth   Microscopic Examination   URINE  Result Value Ref Range   WBC, UA 0-5 0 - 5 /hpf   RBC, UA None seen 0 - 2 /hpf   Epithelial Cells (non renal) 0-10 0 - 10 /hpf   Renal Epithel, UA None seen None seen /hpf   Mucus, UA Present Not Estab.   Bacteria, UA None seen None seen/Few  Urinalysis, Complete  Result Value Ref Range   Specific Gravity, UA 1.025 1.005 - 1.030   pH, UA 6.0 5.0 - 7.5   Color, UA Yellow Yellow   Appearance Ur Clear Clear   Leukocytes, UA Negative Negative   Protein, UA Negative Negative/Trace   Glucose, UA Negative Negative   Ketones, UA Negative Negative   RBC, UA Negative Negative   Bilirubin, UA Negative Negative   Urobilinogen, Ur 0.2 0.2 - 1.0 mg/dL   Nitrite, UA Negative Negative   Microscopic Examination See below:   CBC with Differential/Platelet  Result Value  Ref Range   WBC 9.9 3.4 - 10.8 x10E3/uL   RBC 4.62 3.77 - 5.28 x10E6/uL   Hemoglobin 13.6 11.1 - 15.9 g/dL   Hematocrit 42.0 34.0 - 46.6 %   MCV 91 79 - 97 fL   MCH 29.4 26.6 - 33.0 pg   MCHC 32.4 31.5 - 35.7 g/dL   RDW 14.5 12.3 - 15.4 %   Platelets 301 150 - 450 x10E3/uL   Neutrophils 53 Not Estab. %   Lymphs 31 Not Estab. %   Monocytes 12 Not Estab. %   Eos 4 Not Estab. %   Basos 0 Not Estab. %   Neutrophils Absolute 5.3 1.4 - 7.0 x10E3/uL   Lymphocytes Absolute 3.0 0.7 - 3.1  x10E3/uL   Monocytes Absolute 1.1 (H) 0.1 - 0.9 x10E3/uL   EOS (ABSOLUTE) 0.4 0.0 - 0.4 x10E3/uL   Basophils Absolute 0.0 0.0 - 0.2 x10E3/uL   Immature Granulocytes 0 Not Estab. %   Immature Grans (Abs) 0.0 0.0 - 0.1 x10E3/uL  CMP14+EGFR  Result Value Ref Range   Glucose 77 65 - 99 mg/dL   BUN 11 6 - 20 mg/dL   Creatinine, Ser 0.63 0.57 - 1.00 mg/dL   GFR calc non Af Amer 119 >59 mL/min/1.73   GFR calc Af Amer 137 >59 mL/min/1.73   BUN/Creatinine Ratio 17 9 - 23   Sodium 140 134 - 144 mmol/L   Potassium 4.1 3.5 - 5.2 mmol/L   Chloride 105 96 - 106 mmol/L   CO2 22 20 - 29 mmol/L   Calcium 9.1 8.7 - 10.2 mg/dL   Total Protein 6.1 6.0 - 8.5 g/dL   Albumin 4.0 3.5 - 5.5 g/dL   Globulin, Total 2.1 1.5 - 4.5 g/dL   Albumin/Globulin Ratio 1.9 1.2 - 2.2   Bilirubin Total 0.2 0.0 - 1.2 mg/dL   Alkaline Phosphatase 46 39 - 117 IU/L   AST 13 0 - 40 IU/L   ALT 6 0 - 32 IU/L  Lipid panel  Result Value Ref Range   Cholesterol, Total 145 100 - 199 mg/dL   Triglycerides 111 0 - 149 mg/dL   HDL 43 >39 mg/dL   VLDL Cholesterol Cal 22 5 - 40 mg/dL   LDL Calculated 80 0 - 99 mg/dL   Chol/HDL Ratio 3.4 0.0 - 4.4 ratio  Pap IG, rfx HPV all pth  Result Value Ref Range   DIAGNOSIS: Comment    Specimen adequacy: Comment    Clinician Provided ICD10 Comment    Performed by: Comment    PAP Smear Comment .    Note: Comment    Test Methodology Comment    PAP Reflex Comment        Pertinent labs & imaging results that were available during my care of the patient were reviewed by me and considered in my medical decision making.  Assessment & Plan:  Angel was seen today for animal bite.  Diagnoses and all orders for this visit:  Dog bite, initial encounter Dog bite to left shin two weeks ago. Friends dog, rabies vaccine up to date. Swelling and increased warmth to center of bite. No purulent discharge or fluctuance. Will place on Augmentin for 10 days. Pt aware to report any new or  worsening symptoms. Follow up in 2 weeks or sooner if needed.  -     amoxicillin-clavulanate (AUGMENTIN) 875-125 MG tablet; Take 1 tablet by mouth 2 (two) times daily for 10 days.  History of vaginitis Previous history of  vaginal candidiasis vaginitis after antibiotic use. Will prescribe below. Pt instructed on proper use.  -     fluconazole (DIFLUCAN) 150 MG tablet; 1 po q week x 4 weeks     Continue all other maintenance medications.  Follow up plan: Return in about 2 weeks (around 06/27/2019), or if symptoms worsen or fail to improve, for dog bite.  Continue healthy lifestyle choices, including diet (rich in fruits, vegetables, and lean proteins, and low in salt and simple carbohydrates) and exercise (at least 30 minutes of moderate physical activity daily).  Educational handout given for dog bite  The above assessment and management plan was discussed with the patient. The patient verbalized understanding of and has agreed to the management plan. Patient is aware to call the clinic if they develop any new symptoms or if symptoms persist or worsen. Patient is aware when to return to the clinic for a follow-up visit. Patient educated on when it is appropriate to go to the emergency department.   Monia Pouch, FNP-C [facility] [phone]

## 2019-06-13 NOTE — Patient Instructions (Signed)
Animal Bite, Adult Animal bite wounds can be mild or serious. It is important to get medical treatment to prevent infection. Ask your doctor if you need treatment to prevent an infection that can spread from animals to humans (rabies). Follow these instructions at home: Wound care   Follow instructions from your doctor about how to take care of your wound. Make sure you: ? Wash your hands with soap and water before you change your bandage (dressing). If you cannot use soap and water, use hand sanitizer. ? Change your bandage as told by your doctor. ? Leave stitches (sutures), skin glue, or skin tape (adhesive) strips in place. They may need to stay in place for 2 weeks or longer. If tape strips get loose and curl up, you may trim the loose edges. Do not remove tape strips completely unless your doctor says it is okay.  Check your wound every day for signs of infection. Check for: ? More redness, swelling, or pain. ? More fluid or blood. ? Warmth. ? Pus or a bad smell. Medicines  Take or apply over-the-counter and prescription medicines only as told by your doctor.  If you were prescribed an antibiotic, take or apply it as told by your doctor. Do not stop using the antibiotic even if your wound gets better. General instructions   Keep the injured area raised (elevated) above the level of your heart while you are sitting or lying down.  If directed, put ice on the injured area. ? Put ice in a plastic bag. ? Place a towel between your skin and the bag. ? Leave the ice on for 20 minutes, 2-3 times per day.  Keep all follow-up visits as told by your doctor. This is important. Contact a doctor if:  You have more redness, swelling, or pain around your wound.  Your wound feels warm to the touch.  You have a fever or chills.  You have a general feeling of sickness (malaise).  You feel sick to your stomach (nauseous).  You throw up (vomit).  You have pain that does not get better.  Get help right away if:  You have a red streak going away from your wound.  You have any of these coming from your wound: ? Non-clear fluid. ? More blood. ? Pus or a bad smell.  You have trouble moving your injured area.  You lose feeling (have numbness) or feel tingling anywhere on your body. Summary  It is important to get the right medical treatment for animal bites. Treatment can help you to not get an infection. Ask your doctor if you need treatment to prevent an infection that can spread from animals to humans (rabies).  Check your wound every day for signs of infection, such as more redness or swelling instead of less.  If you have a red streak going away from your wound, get medical help right away. This information is not intended to replace advice given to you by your health care provider. Make sure you discuss any questions you have with your health care provider. Document Released: 08/25/2005 Document Revised: 08/20/2017 Document Reviewed: 03/05/2017 Elsevier Patient Education  2020 [employer]  

## 2019-06-27 ENCOUNTER — Ambulatory Visit: Payer: Commercial Managed Care - PPO | Admitting: Family Medicine

## 2019-08-27 IMAGING — US US PELVIS COMPLETE TRANSABD/TRANSVAG
1 series · 13 of 25 positions shown · non-contrast
Comparison: None

CLINICAL DATA: Initial evaluation for right-sided pelvic pain.



[Series 1: us pelvis complete transabd/transvag · 0.16mm/px · 13 of 104 slices shown]
[im 1/104]
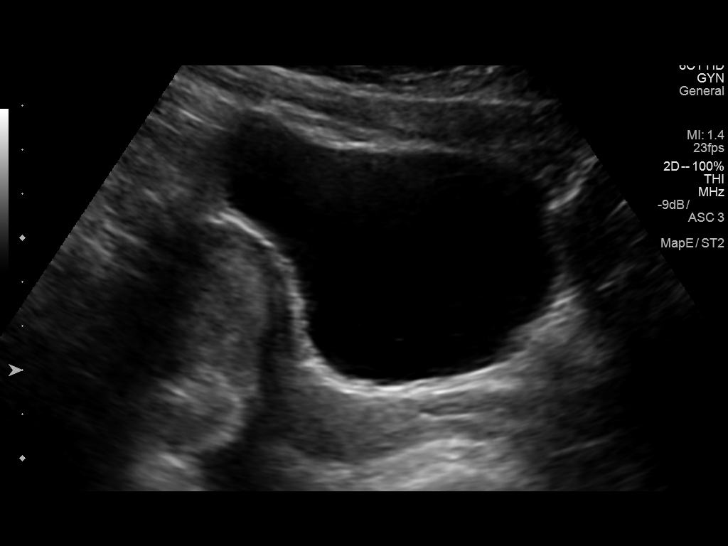
[im 9/104]
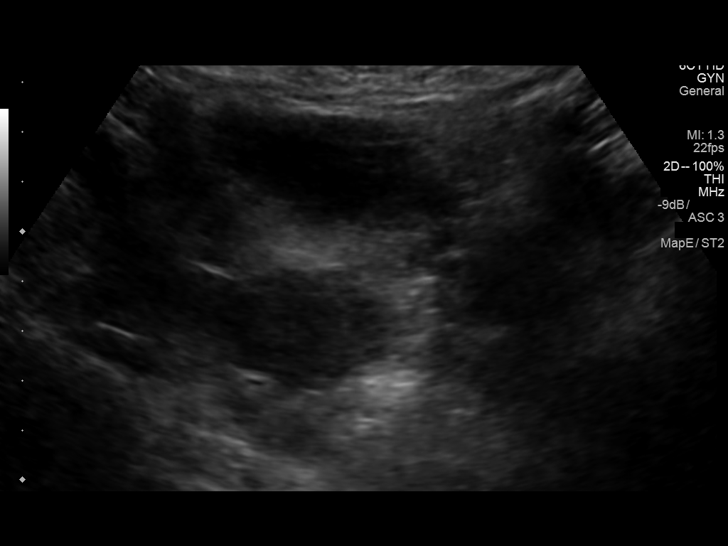
[im 18/104]
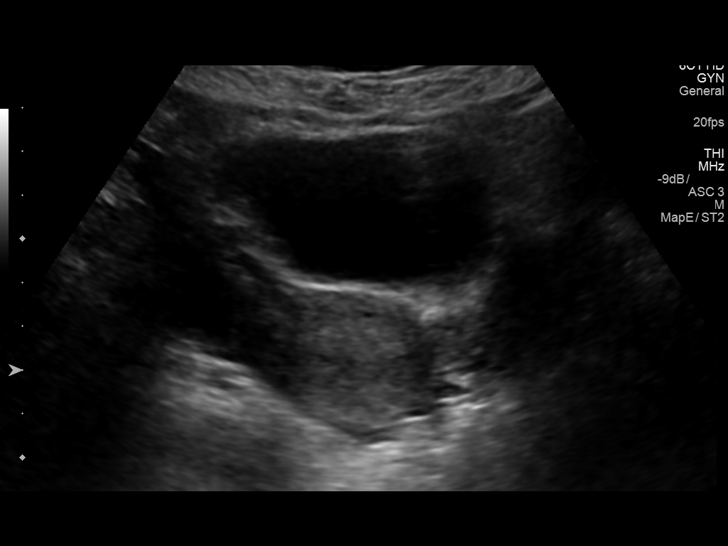
[im 26/104]
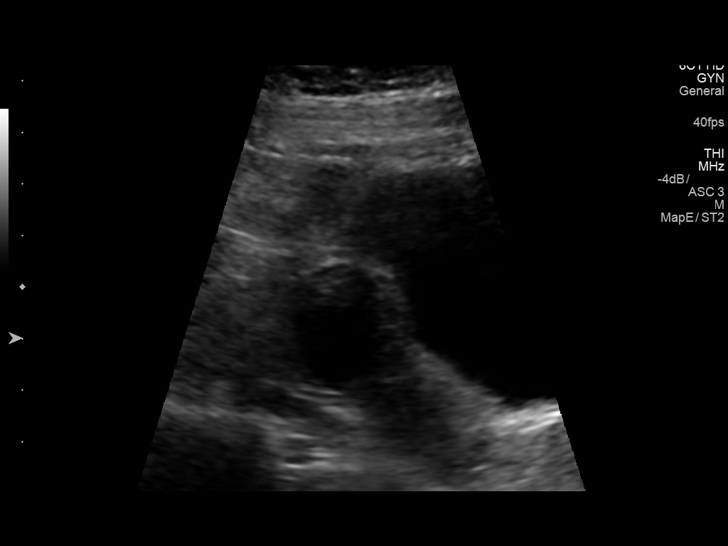
[im 35/104]
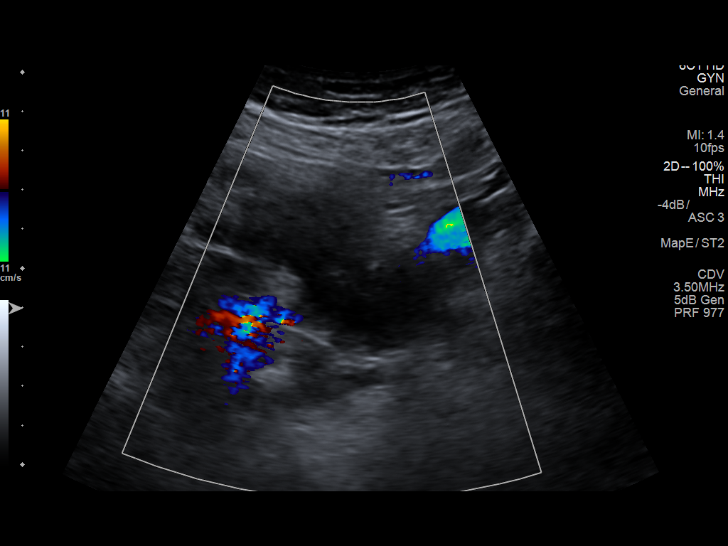
[im 43/104]
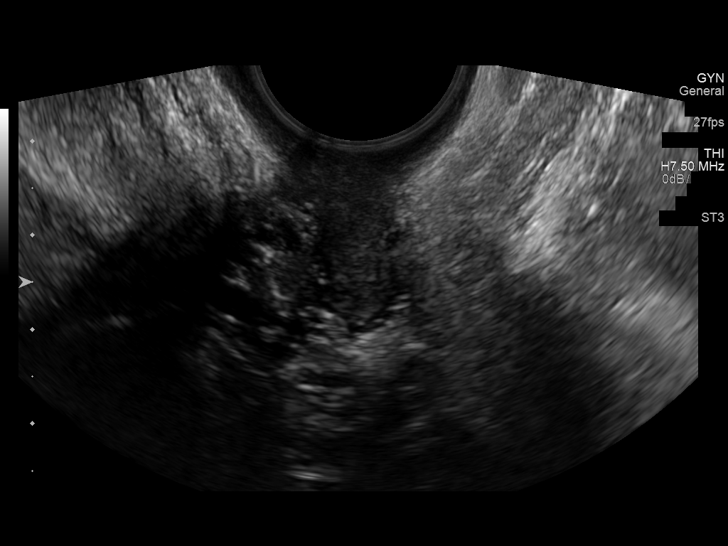
[im 52/104]
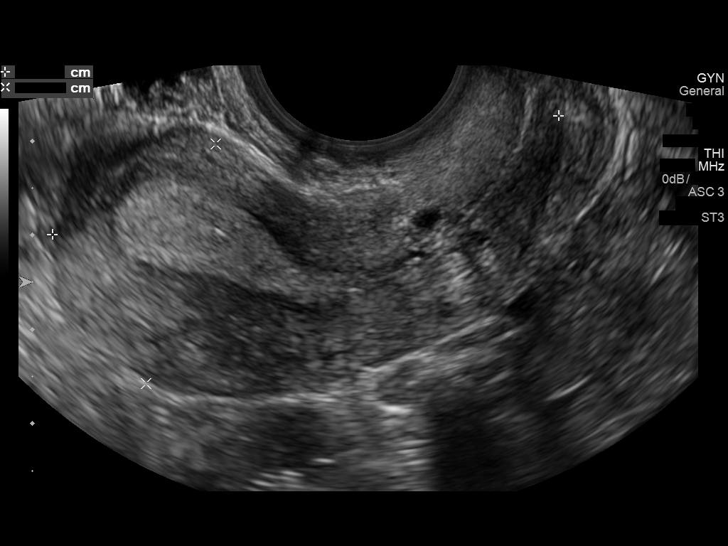
[im 61/104]
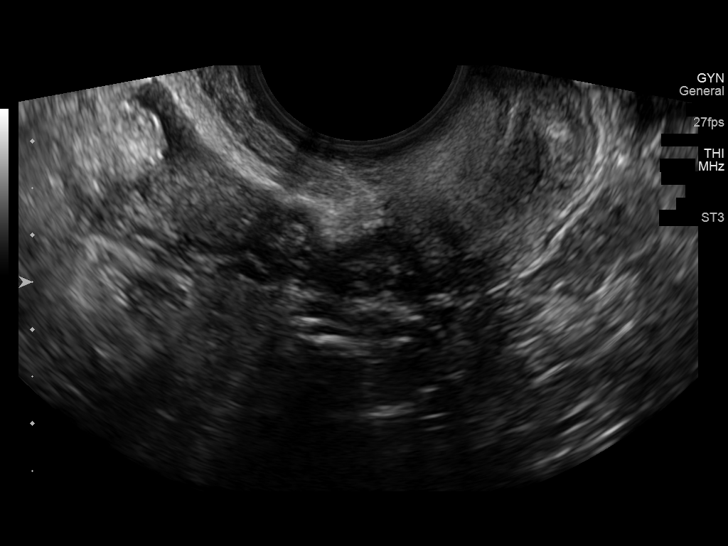
[im 69/104]
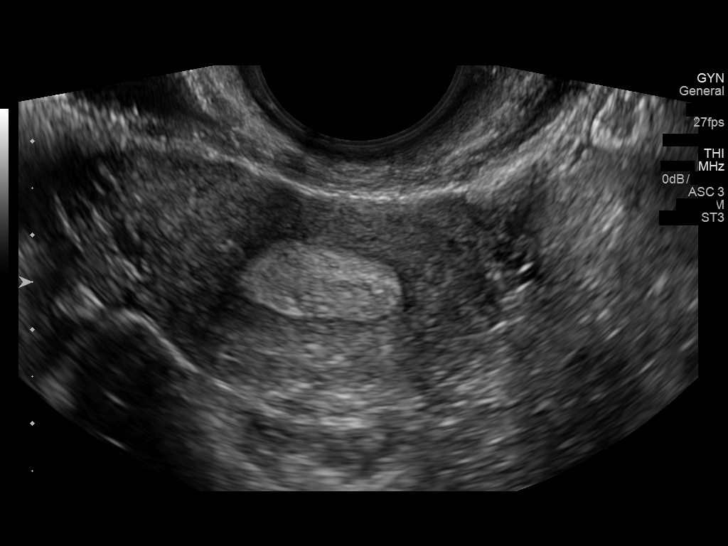
[im 78/104]
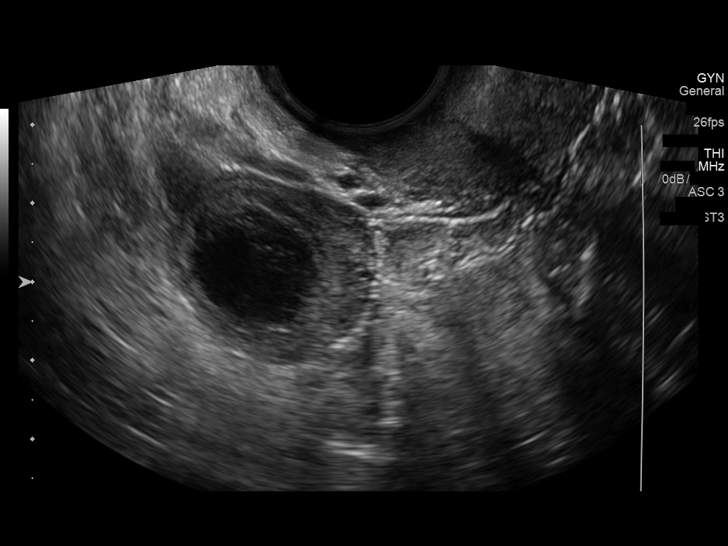
[im 86/104]
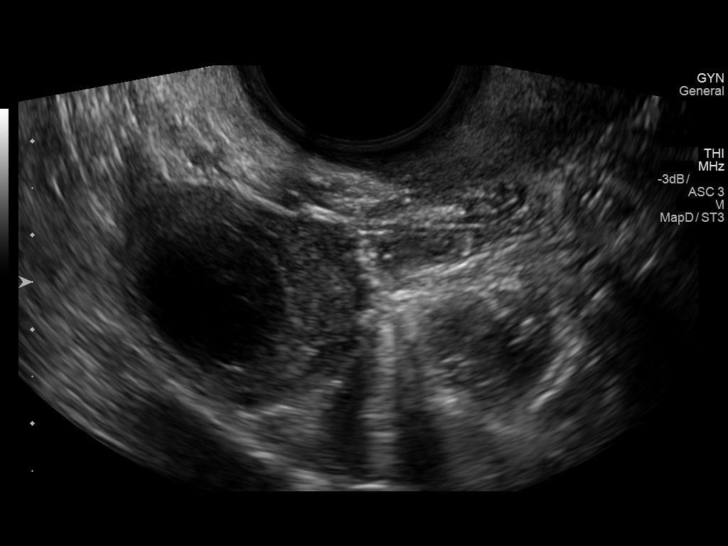
[im 95/104]
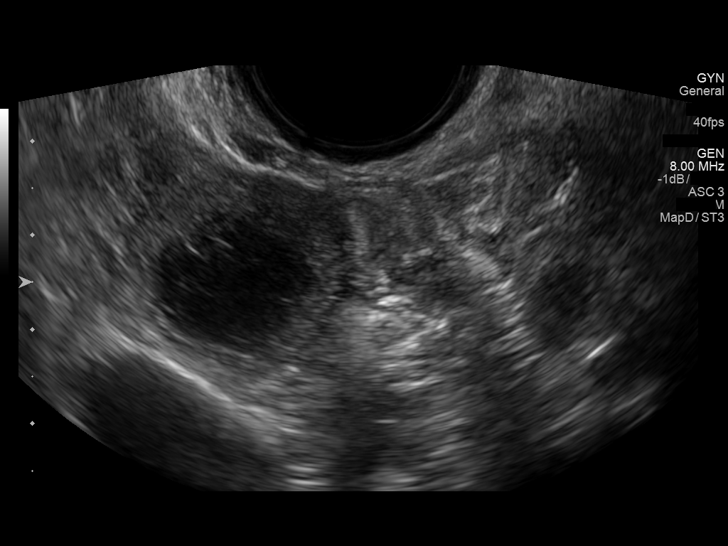
[im 104/104]
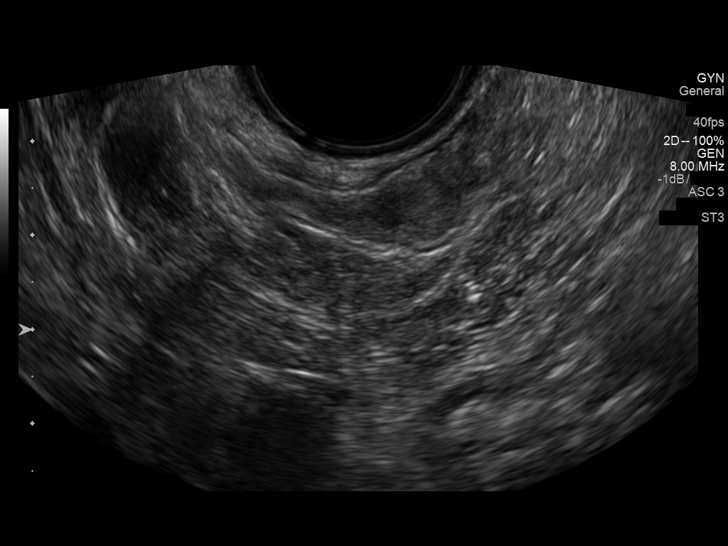

[13 of 25 positions shown; findings below may reference images not displayed]

FINDINGS: Uterus

Measurements: 5.5 x 2.7 x 3.7 cm. No fibroids or other mass
visualized.

Endometrium

Thickness: 9.4 mm.  No focal abnormality visualized.

Right ovary

Measurements: 2.5 x 2.0 x 2.6 cm. 1.7 x 1.6 x 1.6 cm mildly complex
hypoechoic cyst with scattered internal echoes. No internal
vascularity or solid components.

Left ovary

Not visualized.  No adnexal mass.

Other findings

Trace free physiologic fluid present within the pelvis.
IMPRESSION: 1. 1.7 cm mildly complex right ovarian cyst, nonspecific, but could
reflect a mildly complex physiologic cyst with internal
proteinaceous material versus a hemorrhagic cyst. While this is
almost certainly benign, a short interval follow-up ultrasound in
6-12 weeks to ensure resolution could be performed as clinically
warranted.
2. Nonvisualization of the left ovary.  No other adnexal mass.
3. Normal sonographic evaluation of the uterus and endometrium.

## 2021-06-10 ENCOUNTER — Encounter: Payer: Self-pay | Admitting: *Deleted

## 2021-08-28 ENCOUNTER — Encounter: Payer: No Typology Code available for payment source | Admitting: Family Medicine

## 2021-09-06 ENCOUNTER — Encounter: Payer: No Typology Code available for payment source | Admitting: Family Medicine

## 2021-10-21 ENCOUNTER — Ambulatory Visit (INDEPENDENT_AMBULATORY_CARE_PROVIDER_SITE_OTHER): Payer: No Typology Code available for payment source | Admitting: Family Medicine

## 2021-10-21 ENCOUNTER — Encounter: Payer: Self-pay | Admitting: Family Medicine

## 2021-10-21 VITALS — BP 114/63 | HR 66 | Ht 61.0 in | Wt 172.0 lb

## 2021-10-21 DIAGNOSIS — K130 Diseases of lips: Secondary | ICD-10-CM | POA: Diagnosis not present

## 2021-10-21 DIAGNOSIS — Z0001 Encounter for general adult medical examination with abnormal findings: Secondary | ICD-10-CM | POA: Diagnosis not present

## 2021-10-21 DIAGNOSIS — Z716 Tobacco abuse counseling: Secondary | ICD-10-CM

## 2021-10-21 DIAGNOSIS — F419 Anxiety disorder, unspecified: Secondary | ICD-10-CM | POA: Diagnosis not present

## 2021-10-21 DIAGNOSIS — F32A Depression, unspecified: Secondary | ICD-10-CM

## 2021-10-21 DIAGNOSIS — Z0000 Encounter for general adult medical examination without abnormal findings: Secondary | ICD-10-CM

## 2021-10-21 MED ORDER — BUPROPION HCL ER (XL) 150 MG PO TB24: 150.0000 mg | ORAL_TABLET | Freq: Every day | ORAL | 1 refills | Status: DC

## 2021-10-21 NOTE — Progress Notes (Signed)
BP 114/63    Pulse 66    Ht 5' 1"  (1.549 m)    Wt 172 lb (78 kg)    SpO2 98%    BMI 32.50 kg/m    Subjective:   Patient ID: Angel Zimmerman, female    DOB: April 21, 1986, 36 y.o.   MRN: [id number]  HPI: Angel Zimmerman is a 36 y.o. female presenting on 10/21/2021 for Medical Management of Chronic Issues (CPE) and Requesting referral to psych   HPI Physical exam Patient is coming in today for physical exam.  She is currently taking Zoloft 75 mg which was helping her previously but that she has been struggling recently.  She states her spouse passed away from [place] last year and she is raising her 2 children and it has been very challenging to be a single mother and working for the [employer] and she just does not have any motivation or energy and feels down.  She would like to go see counseling.  Patient denies any suicidal ideations.  Patient denies any chest pain, shortness of breath, headaches or vision issues, abdominal complaints, diarrhea, nausea, vomiting, or joint issues.  Depression screen [facility] 2/9 10/21/2021 06/13/2019 04/28/2018 01/29/2018 04/27/2017  Decreased Interest 1 0 0 0 1  Down, Depressed, Hopeless 1 0 0 0 1  PHQ - 2 Score 2 0 0 0 2  Altered sleeping 1 - - - 0  Tired, decreased energy 2 - - - 1  Change in appetite 2 - - - 0  Feeling bad or failure about yourself  1 - - - 0  Trouble concentrating 3 - - - 0  Moving slowly or fidgety/restless 1 - - - 0  Suicidal thoughts 0 - - - 0  PHQ-9 Score 12 - - - 3  Difficult doing work/chores Very difficult - - - Not difficult at all     Relevant past medical, surgical, family and social history reviewed and updated as indicated. Interim medical history since our last visit reviewed. Allergies and medications reviewed and updated.  Review of Systems  Constitutional:  Negative for chills and fever.  HENT:  Negative for ear pain and tinnitus.   Eyes:  Negative for pain and visual disturbance.  Respiratory:  Negative for cough,  chest tightness, shortness of breath and wheezing.   Cardiovascular:  Negative for chest pain, palpitations and leg swelling.  Gastrointestinal:  Negative for abdominal pain, blood in stool, constipation and diarrhea.  Genitourinary:  Negative for dysuria and hematuria.  Musculoskeletal:  Negative for back pain, gait problem and myalgias.  Skin:  Negative for rash.  Neurological:  Negative for dizziness, weakness, light-headedness and headaches.  Psychiatric/Behavioral:  Positive for decreased concentration, dysphoric mood and sleep disturbance. Negative for agitation, behavioral problems and suicidal ideas. The patient is nervous/anxious.   All other systems reviewed and are negative.  Per HPI unless specifically indicated above   Allergies as of 10/21/2021   No Known Allergies      Medication List        Accurate as of October 21, 2021  3:41 PM. If you have any questions, ask your nurse or doctor.          STOP taking these medications    fluconazole 150 MG tablet Commonly known as: Diflucan Stopped by: Fransisca Kaufmann Edwena Mayorga, MD       TAKE these medications    buPROPion 150 MG 24 hr tablet Commonly known as: Wellbutrin XL Take 1 tablet (150 mg  total) by mouth daily. Started by: Worthy Rancher, MD   sertraline 25 MG tablet Commonly known as: ZOLOFT Take 3 tablets by mouth daily.         Objective:   BP 114/63    Pulse 66    Ht 5' 1"  (1.549 m)    Wt 172 lb (78 kg)    SpO2 98%    BMI 32.50 kg/m   Wt Readings from Last 3 Encounters:  10/21/21 172 lb (78 kg)  06/13/19 208 lb (94.3 kg)  07/26/18 228 lb (103.4 kg)    Physical Exam Vitals and nursing note reviewed.  Constitutional:      General: She is not in acute distress.    Appearance: She is well-developed. She is not diaphoretic.  HENT:     Right Ear: Tympanic membrane normal.     Left Ear: Tympanic membrane normal.     Mouth/Throat:     Comments: Angular cheilitis on both sides Eyes:      Conjunctiva/sclera: Conjunctivae normal.     Pupils: Pupils are equal, round, and reactive to light.  Cardiovascular:     Rate and Rhythm: Normal rate and regular rhythm.     Heart sounds: Normal heart sounds. No murmur heard. Pulmonary:     Effort: Pulmonary effort is normal. No respiratory distress.     Breath sounds: Normal breath sounds. No wheezing.  Abdominal:     General: Abdomen is flat. Bowel sounds are normal. There is no distension.     Tenderness: There is no abdominal tenderness. There is no guarding or rebound.  Musculoskeletal:        General: No tenderness. Normal range of motion.  Skin:    General: Skin is warm and dry.     Findings: No rash.  Neurological:     Mental Status: She is alert and oriented to person, place, and time.     Coordination: Coordination normal.  Psychiatric:        Behavior: Behavior normal.      Assessment & Plan:   Problem List Items Addressed This Visit   None Visit Diagnoses     Physical exam    -  Primary   Relevant Orders   CBC with Differential/Platelet   CMP14+EGFR   Lipid panel   Anxiety and depression       Relevant Medications   sertraline (ZOLOFT) 25 MG tablet   buPROPion (WELLBUTRIN XL) 150 MG 24 hr tablet   Other Relevant Orders   Ambulatory referral to Psychiatry   TSH   Angular cheilitis       Encounter for smoking cessation counseling         Recommended to treat angular cheilitis with a topical antifungal and topical triple antibiotic cream for twice a day for at least 2 weeks and then talk to [place] if not improved.  We will add Wellbutrin to her Zoloft and do blood work today.  Follow up plan: Return if symptoms worsen or fail to improve, for 4 to 5-week anxiety depression follow-up.  Counseling provided for all of the vaccine components Orders Placed This Encounter  Procedures   CBC with Differential/Platelet   CMP14+EGFR   Lipid panel   TSH   Ambulatory referral to Psychiatry    Caryl Pina,  MD [facility] 10/21/2021, 3:41 PM

## 2021-10-22 LAB — CBC WITH DIFFERENTIAL/PLATELET
Basophils Absolute: 0.1 10*3/uL (ref 0.0–0.2)
Basos: 1 %
EOS (ABSOLUTE): 0.2 10*3/uL (ref 0.0–0.4)
Eos: 2 %
Hematocrit: 43 % (ref 34.0–46.6)
Hemoglobin: 14.6 g/dL (ref 11.1–15.9)
Immature Grans (Abs): 0 10*3/uL (ref 0.0–0.1)
Immature Granulocytes: 0 %
Lymphocytes Absolute: 3.6 10*3/uL — ABNORMAL HIGH (ref 0.7–3.1)
Lymphs: 27 %
MCH: 29.7 pg (ref 26.6–33.0)
MCHC: 34 g/dL (ref 31.5–35.7)
MCV: 88 fL (ref 79–97)
Monocytes Absolute: 1 10*3/uL — ABNORMAL HIGH (ref 0.1–0.9)
Monocytes: 8 %
Neutrophils Absolute: 8.3 10*3/uL — ABNORMAL HIGH (ref 1.4–7.0)
Neutrophils: 62 %
Platelets: 333 10*3/uL (ref 150–450)
RBC: 4.91 x10E6/uL (ref 3.77–5.28)
RDW: 13.2 % (ref 11.7–15.4)
WBC: 13.3 10*3/uL — ABNORMAL HIGH (ref 3.4–10.8)

## 2021-10-22 LAB — CMP14+EGFR
ALT: 10 IU/L (ref 0–32)
AST: 12 IU/L (ref 0–40)
Albumin/Globulin Ratio: 2.1 (ref 1.2–2.2)
Albumin: 4.4 g/dL (ref 3.8–4.8)
Alkaline Phosphatase: 45 IU/L (ref 44–121)
BUN/Creatinine Ratio: 26 — ABNORMAL HIGH (ref 9–23)
BUN: 15 mg/dL (ref 6–20)
Bilirubin Total: <0.2 mg/dL (ref 0.0–1.2)
CO2: 21 mmol/L (ref 20–29)
Calcium: 9.1 mg/dL (ref 8.7–10.2)
Chloride: 105 mmol/L (ref 96–106)
Creatinine, Ser: 0.57 mg/dL (ref 0.57–1.00)
Globulin, Total: 2.1 g/dL (ref 1.5–4.5)
Glucose: 73 mg/dL (ref 70–99)
Potassium: 4.2 mmol/L (ref 3.5–5.2)
Sodium: 140 mmol/L (ref 134–144)
Total Protein: 6.5 g/dL (ref 6.0–8.5)
eGFR: 121 mL/min/{1.73_m2} (ref >59)

## 2021-10-22 LAB — LIPID PANEL
Chol/HDL Ratio: 2.9 ratio (ref 0.0–4.4)
Cholesterol, Total: 143 mg/dL (ref 100–199)
HDL: 50 mg/dL (ref >39)
LDL Chol Calc (NIH): 78 mg/dL (ref 0–99)
Triglycerides: 79 mg/dL (ref 0–149)
VLDL Cholesterol Cal: 15 mg/dL (ref 5–40)

## 2021-10-22 LAB — TSH: TSH: 1.13 u[IU]/mL (ref 0.450–4.500)

## 2021-10-31 ENCOUNTER — Telehealth: Payer: Self-pay

## 2021-10-31 NOTE — Telephone Encounter (Signed)
Pt would like her most recent labs sent to her psych physician. She does not know their name of the office name. Pt will call back with info.

## 2021-11-25 ENCOUNTER — Encounter: Payer: Self-pay | Admitting: Family Medicine

## 2021-11-25 ENCOUNTER — Ambulatory Visit: Payer: No Typology Code available for payment source | Admitting: Family Medicine

## 2021-11-25 VITALS — BP 117/73 | HR 57 | Ht 61.0 in | Wt 176.0 lb

## 2021-11-25 DIAGNOSIS — D72829 Elevated white blood cell count, unspecified: Secondary | ICD-10-CM

## 2021-11-25 DIAGNOSIS — F32A Depression, unspecified: Secondary | ICD-10-CM | POA: Diagnosis not present

## 2021-11-25 DIAGNOSIS — F419 Anxiety disorder, unspecified: Secondary | ICD-10-CM

## 2021-11-25 MED ORDER — BUPROPION HCL ER (XL) 300 MG PO TB24: 300.0000 mg | ORAL_TABLET | Freq: Every day | ORAL | 2 refills | Status: AC

## 2021-11-25 NOTE — Addendum Note (Signed)
Addended by: Arville Care on: 11/25/2021 02:22 PM ? ? Modules accepted: Orders ? ?

## 2021-11-25 NOTE — Progress Notes (Signed)
? ?BP 117/73   Pulse (!) 57   Ht 5\' 1"  (1.549 m)   Wt 176 lb (79.8 kg)   SpO2 97%   BMI 33.25 kg/m?   ? ?Subjective:  ? ?Patient ID: Angel Zimmerman, female    DOB: 1986-07-24, 36 y.o.   MRN: [id number] ? ?HPI: ?Angel Zimmerman is a 36 y.o. female presenting on 11/25/2021 for Medical Management of Chronic Issues, Anxiety, and Depression ? ? ?HPI ?Anxiety depression recheck ?Patient is coming in for anxiety and depression recheck.  She is currently taking Zoloft 75 mg and we added Wellbutrin 150 mg 1 month ago.  She feels like she is doing a lot better but may be could use a little bit more.  She still is going to the grievance process and does feel like her 2 kids are doing okay with 8, 1 is 6 and 1 is 3.  She denies any suicidal ideations or thoughts of hurting self.  She denies any side effects except for a little bit of dry mouth from the Wellbutrin.  She does feel like her motivation is better. ?Depression screen [facility] 2/9 11/25/2021 10/21/2021 06/13/2019 04/28/2018 01/29/2018  ?Decreased Interest 1 1 0 0 0  ?Down, Depressed, Hopeless 1 1 0 0 0  ?PHQ - 2 Score 2 2 0 0 0  ?Altered sleeping 1 1 - - -  ?Tired, decreased energy 2 2 - - -  ?Change in appetite 2 2 - - -  ?Feeling bad or failure about yourself  1 1 - - -  ?Trouble concentrating 3 3 - - -  ?Moving slowly or fidgety/restless 1 1 - - -  ?Suicidal thoughts 0 0 - - -  ?PHQ-9 Score 12 12 - - -  ?Difficult doing work/chores - Very difficult - - -  ?  ? ?Relevant past medical, surgical, family and social history reviewed and updated as indicated. Interim medical history since our last visit reviewed. ?Allergies and medications reviewed and updated. ? ?Review of Systems  ?Constitutional:  Negative for chills and fever.  ?Eyes:  Negative for visual disturbance.  ?Respiratory:  Negative for chest tightness and shortness of breath.   ?Cardiovascular:  Negative for chest pain and leg swelling.  ?Musculoskeletal:  Negative for back pain and gait problem.  ?Skin:   Negative for rash.  ?Neurological:  Negative for dizziness, light-headedness and headaches.  ?Psychiatric/Behavioral:  Positive for dysphoric mood. Negative for agitation, behavioral problems, self-injury, sleep disturbance and suicidal ideas. The patient is nervous/anxious.   ?All other systems reviewed and are negative. ? ?Per HPI unless specifically indicated above ? ? ?Allergies as of 11/25/2021   ?No Known Allergies ?  ? ?  ?Medication List  ?  ? ?  ? Accurate as of November 25, 2021  2:20 PM. If you have any questions, ask your nurse or doctor.  ?  ?  ? ?  ? ?buPROPion 300 MG 24 hr tablet ?Commonly known as: Wellbutrin XL ?Take 1 tablet (300 mg total) by mouth daily. ?What changed:  ?medication strength ?how much to take ?Changed by: Worthy Rancher, MD ?  ?sertraline 25 MG tablet ?Commonly known as: ZOLOFT ?Take 3 tablets by mouth daily. ?  ? ?  ? ? ? ?Objective:  ? ?BP 117/73   Pulse (!) 57   Ht 5\' 1"  (1.549 m)   Wt 176 lb (79.8 kg)   SpO2 97%   BMI 33.25 kg/m?   ?Wt Readings from Last 3 Encounters:  ?  11/25/21 176 lb (79.8 kg)  ?10/21/21 172 lb (78 kg)  ?06/13/19 208 lb (94.3 kg)  ?  ?Physical Exam ?Vitals and nursing note reviewed.  ?Constitutional:   ?   General: She is not in acute distress. ?   Appearance: She is well-developed. She is not diaphoretic.  ?Eyes:  ?   Conjunctiva/sclera: Conjunctivae normal.  ?Cardiovascular:  ?   Rate and Rhythm: Normal rate and regular rhythm.  ?   Heart sounds: Normal heart sounds. No murmur heard. ?Pulmonary:  ?   Effort: Pulmonary effort is normal. No respiratory distress.  ?   Breath sounds: Normal breath sounds. No wheezing.  ?Musculoskeletal:     ?   General: No tenderness. Normal range of motion.  ?Skin: ?   General: Skin is warm and dry.  ?   Findings: No rash.  ?Neurological:  ?   Mental Status: She is alert and oriented to person, place, and time.  ?   Coordination: Coordination normal.  ?Psychiatric:     ?   Mood and Affect: Mood is anxious and depressed.      ?   Behavior: Behavior normal.     ?   Thought Content: Thought content does not include suicidal ideation. Thought content does not include suicidal plan.  ? ? ? ? ?Assessment & Plan:  ? ?Problem List Items Addressed This Visit   ?None ?Visit Diagnoses   ? ? Anxiety and depression    -  Primary  ? Relevant Medications  ? buPROPion (WELLBUTRIN XL) 300 MG 24 hr tablet  ? ?  ?  ?Increase Wellbutrin to 300 mg we will see how she does, come back in 4 to 5 weeks again. ?Follow up plan: ?Return if symptoms worsen or fail to improve, for 4 to 5-week anxiety and depression follow-up. ? ?Counseling provided for all of the vaccine components ?No orders of the defined types were placed in this encounter. ? ? ?Caryl Pina, MD ?[place] ?11/25/2021, 2:20 PM ? ? ? ? ?

## 2021-11-26 LAB — CBC WITH DIFFERENTIAL/PLATELET
Basophils Absolute: 0.1 10*3/uL (ref 0.0–0.2)
Basos: 1 %
EOS (ABSOLUTE): 0.2 10*3/uL (ref 0.0–0.4)
Eos: 2 %
Hematocrit: 40 % (ref 34.0–46.6)
Hemoglobin: 13.5 g/dL (ref 11.1–15.9)
Immature Grans (Abs): 0 10*3/uL (ref 0.0–0.1)
Immature Granulocytes: 0 %
Lymphocytes Absolute: 3 10*3/uL (ref 0.7–3.1)
Lymphs: 29 %
MCH: 29.9 pg (ref 26.6–33.0)
MCHC: 33.8 g/dL (ref 31.5–35.7)
MCV: 89 fL (ref 79–97)
Monocytes Absolute: 0.9 10*3/uL (ref 0.1–0.9)
Monocytes: 9 %
Neutrophils Absolute: 6 10*3/uL (ref 1.4–7.0)
Neutrophils: 59 %
Platelets: 316 10*3/uL (ref 150–450)
RBC: 4.52 x10E6/uL (ref 3.77–5.28)
RDW: 13.5 % (ref 11.7–15.4)
WBC: 10.2 10*3/uL (ref 3.4–10.8)

## 2021-12-25 ENCOUNTER — Ambulatory Visit: Payer: No Typology Code available for payment source | Admitting: Family Medicine

## 2022-01-01 ENCOUNTER — Encounter: Payer: Self-pay | Admitting: Family Medicine
